# Patient Record
Sex: Female | Born: 1965 | Race: Black or African American | Hispanic: No | Marital: Married | Smoking: Never smoker
Health system: Southern US, Community
[De-identification: ages and names within clinical notes are randomized; demographics above are authoritative.]

## PROBLEM LIST (undated history)

## (undated) DIAGNOSIS — C801 Malignant (primary) neoplasm, unspecified: Secondary | ICD-10-CM

## (undated) DIAGNOSIS — M1990 Unspecified osteoarthritis, unspecified site: Secondary | ICD-10-CM

## (undated) HISTORY — PX: WISDOM TOOTH EXTRACTION: SHX21

## (undated) HISTORY — PX: BREAST SURGERY: SHX581

## (undated) HISTORY — PX: TONSILLECTOMY: SUR1361

## (undated) HISTORY — DX: Unspecified osteoarthritis, unspecified site: M19.90

## (undated) HISTORY — PX: BREAST EXCISIONAL BIOPSY: SUR124

---

## 1999-03-16 ENCOUNTER — Ambulatory Visit (HOSPITAL_COMMUNITY): Admission: RE | Admit: 1999-03-16 | Discharge: 1999-03-16 | Payer: Self-pay | Admitting: Family Medicine

## 1999-03-16 ENCOUNTER — Encounter: Payer: Self-pay | Admitting: Family Medicine

## 2000-09-08 ENCOUNTER — Other Ambulatory Visit: Admission: RE | Admit: 2000-09-08 | Discharge: 2000-09-08 | Payer: Self-pay | Admitting: Obstetrics and Gynecology

## 2002-06-07 ENCOUNTER — Other Ambulatory Visit: Admission: RE | Admit: 2002-06-07 | Discharge: 2002-06-07 | Payer: Self-pay | Admitting: Obstetrics and Gynecology

## 2002-06-09 ENCOUNTER — Encounter: Payer: Self-pay | Admitting: Obstetrics and Gynecology

## 2002-06-09 ENCOUNTER — Encounter: Admission: RE | Admit: 2002-06-09 | Discharge: 2002-06-09 | Payer: Self-pay | Admitting: Obstetrics and Gynecology

## 2003-07-11 ENCOUNTER — Other Ambulatory Visit: Admission: RE | Admit: 2003-07-11 | Discharge: 2003-07-11 | Payer: Self-pay | Admitting: Obstetrics and Gynecology

## 2005-06-26 ENCOUNTER — Other Ambulatory Visit: Admission: RE | Admit: 2005-06-26 | Discharge: 2005-06-26 | Payer: Self-pay | Admitting: Obstetrics and Gynecology

## 2006-01-20 ENCOUNTER — Inpatient Hospital Stay (HOSPITAL_COMMUNITY): Admission: AD | Admit: 2006-01-20 | Discharge: 2006-01-20 | Payer: Self-pay | Admitting: Obstetrics and Gynecology

## 2006-01-21 ENCOUNTER — Encounter (INDEPENDENT_AMBULATORY_CARE_PROVIDER_SITE_OTHER): Payer: Self-pay | Admitting: *Deleted

## 2006-01-21 ENCOUNTER — Inpatient Hospital Stay (HOSPITAL_COMMUNITY): Admission: AD | Admit: 2006-01-21 | Discharge: 2006-01-23 | Payer: Self-pay | Admitting: Obstetrics and Gynecology

## 2006-03-05 ENCOUNTER — Encounter: Admission: RE | Admit: 2006-03-05 | Discharge: 2006-03-05 | Payer: Self-pay | Admitting: Obstetrics and Gynecology

## 2006-12-05 ENCOUNTER — Encounter: Admission: RE | Admit: 2006-12-05 | Discharge: 2006-12-05 | Payer: Self-pay | Admitting: Obstetrics and Gynecology

## 2008-03-08 ENCOUNTER — Encounter: Admission: RE | Admit: 2008-03-08 | Discharge: 2008-03-08 | Payer: Self-pay | Admitting: Obstetrics and Gynecology

## 2009-04-04 ENCOUNTER — Encounter: Admission: RE | Admit: 2009-04-04 | Discharge: 2009-04-04 | Payer: Self-pay | Admitting: Obstetrics and Gynecology

## 2010-04-05 ENCOUNTER — Encounter: Admission: RE | Admit: 2010-04-05 | Discharge: 2010-04-05 | Payer: Self-pay | Admitting: Family Medicine

## 2010-10-26 NOTE — H&P (Signed)
Wendy Gates, Wendy Gates NO.:  [id number]   MEDICAL RECORD NO.:  [id number]          PATIENT TYPE:  INP   LOCATION:  9165                          FACILITY:  [facility]   PHYSICIAN:  Osborn Coho, M.D.   DATE OF BIRTH:  1965/07/02   DATE OF ADMISSION:  01/21/2006  DATE OF DISCHARGE:                                HISTORY & PHYSICAL   Wendy Gates is a 45 year old gravida 1, para 0 at 38-1/7 weeks who  presented with spontaneous rupture of membranes at 6:30 a.m. this morning.  She had been contracting all night.  She was seen yesterday in maternity  admissions status post a nonreactive NST with a blood pressure at that time  of 130/90.  Patient was sent to maternity admissions unit.  PIH laboratories  were normal.  BPP showed 6/8.  The patient was scheduled for follow-up in  the office today.  Pregnancy has been remarkable for positive group B Strep;  2) advanced maternal age with a normal first trimester screen and normal  AFP, amniocentesis was declined; 3) probable chronic hypertension.  Patient  was prescribed Aldomet at about 14 weeks but she never took the  prescription.  Blood pressures have been slightly labile at times, but had  had no consistent elevations.  She had a 24-hour urine on January 16, 2006  showing 197 mg of protein in a 24-hour specimen.  4) history of infertility;  5) history of ectopic; 6) migraines; 7) first trimester bleeding.   PRENATAL LABORATORIES:  Blood type is B+.  Rh antibody negative.  VDRL  nonreactive.  Rubella titer positive.  Hepatitis B surface antigen negative.  HIV was declined.  Sickle cell test was negative.  GC/Chlamydia cultures  were negative in the first trimester.  Pap was normal in first trimester.  Cystic fibrosis testing was declined.  Patient did have first trimester  screening at [facility] which was normal.  She also had a normal follow-  up AFP.  Group B Strep culture was positive at 36 weeks.  GC/Chlamydia  cultures were negative at that time.  EDC of February 03, 2006 was established  by last menstrual period and was in agreement with ultrasound at  approximately 6 weeks.  Hemoglobin upon entry into practice was 14.1.  It  was 11.1 at 26 weeks.  Patient's Glucola was also normal.   HISTORY OF PRESENT PREGNANCY:  Patient entered care at approximately 8  weeks.  She elected to have first trimester screening.  This was done at  [facility] with a normal follow-up AFP.  She had elevation of her blood  pressure at 14 weeks and was prescribed Aldomet 250 b.i.d.  A 24-hour urine  was also done for a baseline.  She did not end up taking the Aldomet.  She  had an ultrasound at 18 weeks showing normal growth and development with an  adjusted Down syndrome risk of 1 in 894.  She had a normal Glucola.  She had  another ultrasound at 32 weeks for size greater than dates.  Growth was at  the 88th percentile.  AFI was  17.  Fetus at that time was in a breech  presentation but then reevaluation at 34 weeks showed vertex.  Her blood  pressure had some lability in the third trimester.  A 24-hour urine was done  with [facility] laboratories.  A 24-hour urine showed 197 mg of protein with normal  PIH laboratories.  Bi-weekly NSTs were done.  She had an NST yesterday which  was August 13 showing nonreactivity with minimal variability with occasional  mild variables.  Blood pressure at that time was also 138/90.  She was sent  to [facility]U for further evaluation.  She had normal PIH laboratories.  Her blood  pressures were normal at MAU.  BPP was 6/8.  Clean catch urine was negative  and PIH laboratories were normal.  The decision was made to follow up today.  However, the patient's labor ensued prior to that time.   OBSTETRICAL HISTORY:  In 1992 she had a 4-6 week tubal pregnancy.  In 2005  she had a 4-6 week spontaneous miscarriage without complication.   MEDICAL HISTORY:  She is a previous [employer] user.  She  reports  usual childhood illnesses.  She also had a history of bladder infection  years ago.  She has a history of migraines.  She had no known medication  allergy.   FAMILY HISTORY:  Her father has heart disease.  Her father's sister and  brother have hypertension.  Her sister has anemia.  Her mother and maternal  grandmother are both deceased from complications of diabetes.  Her sister  has thyroid dysfunction.  Her mother died of breast cancer.  Her paternal  aunt died of breast cancer.   SURGICAL HISTORY:  Wisdom teeth removed in 2003 and tonsils removed in 1987.   GENETIC HISTORY:  Remarkable for the patient's advanced maternal age of 55.  Her paternal grandfather had twins.  Her niece has twins and several people  on the father of the baby's mother's side of the family have twins.   SOCIAL HISTORY:  Patient is married to the father of the baby.  He is  involved and supportive.  His name is Blonnie Maske.  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  Her husband has one year of college.  He is a  Emergency planning/management officer.  She has been followed by the physician service at [facility].  She denies any alcohol, drug, or tobacco use during this  pregnancy.   PHYSICAL EXAMINATION:  VITAL SIGNS:  Blood pressure is 126/79.  Other vital  signs are stable.  HEENT:  Within normal limits.  LUNGS:  Bilateral breath sounds are clear.  HEART:  Regular rate and rhythm without murmur.  BREASTS:  Soft and nontender.  ABDOMEN:  Fundal height is approximately 39 cm.  Estimated fetal weight is 7-  8 pounds.  Uterine contractions are every q.3 minutes, 90 seconds in  duration, moderate quality.  Fetal heart rate is currently nonreactive.  There are some mild variables noted with uterine contractions.  Fetal scalp  electrode was placed.  PELVIC:  Cervix initially on evaluation was 2, 90%, vertex at a -1.  After the second uterine contraction was variable a scalp lead was applied and the   cervix at that time was 3-4, 100%, vertex at a -1 station.  EXTREMITIES:  Deep tendon reflexes are 2+ without clonus.  There is a trace  edema noted.   IMPRESSION:  1. Intrauterine pregnancy at 38-1/7 weeks.  2.  Early labor with spontaneous rupture of membranes, clear fluid.  3. Positive group B Strep.  4. Variable decelerations.  5. Probable chronic hypertension, but on no current medications.   PLAN:  1. Admit to birthing suite for consult with Dr. Su Hilt as attending      physician.  2. Routine physician orders.  3. Plan group B Strep prophylaxis and penicillin G per standard dosing.  4. M.D.'s will follow.      Renaldo Reel Emilee Hero, C.N.M.      Osborn Coho, M.D.  Electronically Signed    VLL/MEDQ  D:  01/21/2006  T:  01/21/2006  Job:  [id number]

## 2011-03-20 ENCOUNTER — Other Ambulatory Visit: Payer: Self-pay | Admitting: Family Medicine

## 2011-03-20 DIAGNOSIS — Z1231 Encounter for screening mammogram for malignant neoplasm of breast: Secondary | ICD-10-CM

## 2011-04-10 ENCOUNTER — Ambulatory Visit: Payer: Self-pay

## 2011-04-16 ENCOUNTER — Ambulatory Visit
Admission: RE | Admit: 2011-04-16 | Discharge: 2011-04-16 | Disposition: A | Discharge status: Home / Self Care | Payer: BC Managed Care – PPO | Source: Ambulatory Visit | Attending: Family Medicine | Admitting: Family Medicine

## 2011-04-16 DIAGNOSIS — Z1231 Encounter for screening mammogram for malignant neoplasm of breast: Secondary | ICD-10-CM

## 2012-03-18 ENCOUNTER — Other Ambulatory Visit: Payer: Self-pay | Admitting: Family Medicine

## 2012-03-18 DIAGNOSIS — Z1231 Encounter for screening mammogram for malignant neoplasm of breast: Secondary | ICD-10-CM

## 2012-04-21 ENCOUNTER — Ambulatory Visit
Admission: RE | Admit: 2012-04-21 | Discharge: 2012-04-21 | Disposition: A | Discharge status: Home / Self Care | Payer: BC Managed Care – PPO | Source: Ambulatory Visit | Attending: Family Medicine | Admitting: Family Medicine

## 2012-04-21 DIAGNOSIS — Z1231 Encounter for screening mammogram for malignant neoplasm of breast: Secondary | ICD-10-CM

## 2012-04-29 ENCOUNTER — Ambulatory Visit (INDEPENDENT_AMBULATORY_CARE_PROVIDER_SITE_OTHER): Payer: BC Managed Care – PPO | Admitting: Obstetrics and Gynecology

## 2012-04-29 ENCOUNTER — Encounter: Payer: Self-pay | Admitting: Obstetrics and Gynecology

## 2012-04-29 VITALS — BP 112/80 | HR 80 | Ht 62.0 in | Wt 158.0 lb

## 2012-04-29 DIAGNOSIS — R32 Unspecified urinary incontinence: Secondary | ICD-10-CM

## 2012-04-29 DIAGNOSIS — N926 Irregular menstruation, unspecified: Secondary | ICD-10-CM

## 2012-04-29 DIAGNOSIS — Z124 Encounter for screening for malignant neoplasm of cervix: Secondary | ICD-10-CM

## 2012-04-29 DIAGNOSIS — Z01419 Encounter for gynecological examination (general) (routine) without abnormal findings: Secondary | ICD-10-CM

## 2012-04-29 MED ORDER — TOLTERODINE TARTRATE ER 2 MG PO CP24: 2.0000 mg | ORAL_CAPSULE | Freq: Every day | ORAL | Status: DC

## 2012-04-29 NOTE — Patient Instructions (Signed)
Urinary Incontinence Your doctor wants you to have this information about urinary incontinence. This is the inability to keep urine in your body until you decide to release it. CAUSES  Prostate gland enlargement is a common cause of urinary incontinence. But there are many different causes for losing urinary control. They include:  Medicines.  Infections.  Prostate problems.  Surgery.  Neurological diseases.  Emotional factors. DIAGNOSIS  Evaluating the cause of incontinence is important in choosing the best treatment. This may require:  An ultrasound exam.  Kidney and bladder X-rays.  Cystoscopy. This is an exam of the bladder using a narrow scope. TREATMENT  For incontinent patients, normal daily hygiene and using changing pads or adult diapers regularly will prevent offensive odors and skin damage from the moisture. Changing your medicines may help control incontinence. Your caregiver may prescribe some medicines to help you regain control. Avoid caffeine. It can over-stimulate the bladder. Use the bathroom regularly. Try about every 2 to 3 hours even if you do not feel the need. Take time to empty your bladder completely. After urinating, wait a minute. Then try to urinate again. External devices used to catch urine or an indwelling urine catheter (Foley catheter) may be needed as well. Some prostate gland problems require surgery to correct. Call your caregiver for more information. Document Released: 07/04/2004 Document Revised: 08/19/2011 Document Reviewed: 06/29/2008 ExitCare Patient Information 2013 [employer].  

## 2012-04-29 NOTE — Progress Notes (Signed)
Last Pap: 04/22/11 WNL: Yes Regular Periods:yes Contraception: none  Monthly Breast exam:yes Tetanus<41yrs:yes Nl.Bladder Function:yes Daily BMs:yes Healthy Diet:yes Calcium:no Mammogram:yes Date of Mammogram: 04/21/12 Exercise:yes Have often Exercise: walking daily  Seatbelt: yes Abuse at home: no Stressful work:yes Sigmoid-colonoscopy: n/a Bone Density: No PCP: Dr. Merri Brunette Change in PMH: none Change in ZOX:WRUE BP 112/80  Pulse 80  Ht 5\' 2"  (1.575 m)  Wt 158 lb (71.668 kg)  BMI 28.90 kg/m2  LMP 04/13/2012 Pt with complaints:yes 1. She c/o irregular vaginal bleeding for 4 months.  Her periods lasts for five days then on day 7 she has bleeding again.  No pain.  She uses a tampoon q 4 hours.  She also c/o urinary urgency.  No dysuria.  No caffiene intake daily she drinks two bottles of water a day Physical Examination: General appearance - alert, well appearing, and in no distress Mental status - normal mood, behavior, speech, dress, motor activity, and thought processes Neck - supple, no significant adenopathy,  thyroid exam: thyroid is normal in size without nodules or tenderness Chest - clear to auscultation, no wheezes, rales or rhonchi, symmetric air entry Heart - normal rate and regular rhythm Abdomen - soft, nontender, nondistended, no masses or organomegaly Breasts - breasts appear normal, no suspicious masses, no skin or nipple changes or axillary nodes Pelvic - normal external genitalia, vulva, vagina, cervix, uterus and adnexa Rectal - rectal exam not indicated Back exam - full range of motion, no tenderness, palpable spasm or pain on motion Neurological - alert, oriented, normal speech, no focal findings or movement disorder noted Musculoskeletal - no joint tenderness, deformity or swelling Extremities - no edema, redness or tenderness in the calves or thighs Skin - normal coloration and turgor, no rashes, no suspicious skin lesions noted Routine  exam Irregular bleeding Urge incontinence Pap sent yes Mammogram due no Natural Family Planning used for contraception RT for shg/emb/us Pt declined urodynamics.  Check urine cx.  Do kegels.  She desires to try detrol LA.  R&B reviewed

## 2012-04-30 LAB — PAP IG W/ RFLX HPV ASCU

## 2012-05-01 LAB — URINE CULTURE: Organism ID, Bacteria: NO GROWTH

## 2012-05-22 ENCOUNTER — Encounter: Payer: Self-pay | Admitting: Obstetrics and Gynecology

## 2012-05-22 ENCOUNTER — Ambulatory Visit (INDEPENDENT_AMBULATORY_CARE_PROVIDER_SITE_OTHER): Payer: BC Managed Care – PPO | Admitting: Obstetrics and Gynecology

## 2012-05-22 ENCOUNTER — Ambulatory Visit (INDEPENDENT_AMBULATORY_CARE_PROVIDER_SITE_OTHER): Payer: BC Managed Care – PPO

## 2012-05-22 VITALS — BP 102/70 | Wt 156.0 lb

## 2012-05-22 DIAGNOSIS — N926 Irregular menstruation, unspecified: Secondary | ICD-10-CM

## 2012-05-22 NOTE — Progress Notes (Signed)
Pt here for [facility] and EMBX SHG/EMBX:  The patient was consented for both procedures.  She was placed in dorsal lithotomy position and speculum placed in the vagina.  The cervix was cleansed with three betadine swabs.  The endometrial pipet was placed in the the endometrial cavity through the cervix.  The uterus did sound to 10cm.  The pipet was removed and specimen was sent to pathology.  The sonohysterography catheter was then placed through the cervix and vaginal probe placed back in the vagina. US uterus 9.96 by 6.86cm Normal endometrium Two fibroids one abuts the endometrial canal Largest is 3.8 cm All txs reviewed with the pt.  Pt chose obs for now

## 2012-05-22 NOTE — Patient Instructions (Signed)
Fibroids Fibroids are lumps (tumors) that can occur any place in a woman's body. These lumps are not cancerous. Fibroids vary in size, weight, and where they grow. HOME CARE  Do not take aspirin.  Write down the number of pads or tampons you use during your period. Tell your doctor. This can help determine the best treatment for you. GET HELP RIGHT AWAY IF:  You have pain in your lower belly (abdomen) that is not helped with medicine.  You have cramps that are not helped with medicine.  You have more bleeding between or during your period.  You feel lightheaded or pass out (faint).  Your lower belly pain gets worse. MAKE SURE YOU:  Understand these instructions.  Will watch your condition.  Will get help right away if you are not doing well or get worse. Document Released: 06/29/2010 Document Revised: 08/19/2011 Document Reviewed: 06/29/2010 ExitCare Patient Information 2013 [employer].  

## 2012-05-26 LAB — PATHOLOGY

## 2012-06-04 ENCOUNTER — Telehealth: Payer: Self-pay

## 2012-06-04 NOTE — Telephone Encounter (Signed)
Spoke with pt rgd labs informed endo biopsy wnl pt voice understanding 

## 2012-06-04 NOTE — Telephone Encounter (Signed)
Message copied by Rolla Plate on Thu Jun 04, 2012 10:28 AM ------      Message from: Jaymes Graff      Created: Mon Jun 01, 2012  8:49 AM       Please call the patient and let her know her endometrial biopsy is normal

## 2013-03-30 ENCOUNTER — Other Ambulatory Visit: Payer: Self-pay

## 2013-03-30 DIAGNOSIS — Z1231 Encounter for screening mammogram for malignant neoplasm of breast: Secondary | ICD-10-CM

## 2013-04-28 ENCOUNTER — Ambulatory Visit
Admission: RE | Admit: 2013-04-28 | Discharge: 2013-04-28 | Disposition: A | Discharge status: Home / Self Care | Payer: BC Managed Care – PPO | Source: Ambulatory Visit

## 2013-04-28 DIAGNOSIS — Z1231 Encounter for screening mammogram for malignant neoplasm of breast: Secondary | ICD-10-CM

## 2013-04-29 ENCOUNTER — Other Ambulatory Visit: Payer: Self-pay | Admitting: Obstetrics and Gynecology

## 2013-04-29 DIAGNOSIS — R928 Other abnormal and inconclusive findings on diagnostic imaging of breast: Secondary | ICD-10-CM

## 2013-05-11 ENCOUNTER — Ambulatory Visit
Admission: RE | Admit: 2013-05-11 | Discharge: 2013-05-11 | Disposition: A | Discharge status: Home / Self Care | Payer: BC Managed Care – PPO | Source: Ambulatory Visit | Attending: Obstetrics and Gynecology | Admitting: Obstetrics and Gynecology

## 2013-05-11 ENCOUNTER — Other Ambulatory Visit: Payer: Self-pay | Admitting: Obstetrics and Gynecology

## 2013-05-11 ENCOUNTER — Other Ambulatory Visit: Payer: Self-pay | Admitting: Diagnostic Radiology

## 2013-05-11 DIAGNOSIS — R928 Other abnormal and inconclusive findings on diagnostic imaging of breast: Secondary | ICD-10-CM

## 2013-05-11 DIAGNOSIS — R921 Mammographic calcification found on diagnostic imaging of breast: Secondary | ICD-10-CM

## 2013-05-11 DIAGNOSIS — R920 Mammographic microcalcification found on diagnostic imaging of breast: Secondary | ICD-10-CM

## 2013-05-18 ENCOUNTER — Ambulatory Visit (INDEPENDENT_AMBULATORY_CARE_PROVIDER_SITE_OTHER): Payer: BC Managed Care – PPO | Admitting: General Surgery

## 2013-05-18 ENCOUNTER — Encounter (INDEPENDENT_AMBULATORY_CARE_PROVIDER_SITE_OTHER): Payer: Self-pay

## 2013-05-18 ENCOUNTER — Encounter (INDEPENDENT_AMBULATORY_CARE_PROVIDER_SITE_OTHER): Payer: Self-pay | Admitting: General Surgery

## 2013-05-18 VITALS — BP 120/70 | HR 88 | Resp 16 | Ht 63.0 in | Wt 153.0 lb

## 2013-05-18 DIAGNOSIS — N62 Hypertrophy of breast: Secondary | ICD-10-CM

## 2013-05-18 DIAGNOSIS — N6099 Unspecified benign mammary dysplasia of unspecified breast: Secondary | ICD-10-CM

## 2013-05-18 NOTE — Patient Instructions (Signed)
Plan for right breast wire localized lumpectomy 

## 2013-05-18 NOTE — Progress Notes (Signed)
Patient ID: Wendy Gates, female   DOB: [birth year], 47 y.o.   MRN: [id number]  Chief Complaint  Patient presents with  . Breast Problem    lobular hyperplasia    HPI Wendy Gates is a 47 y.o. female.  We're asked to see the patient in consultation by Dr. Merri Brunette to evaluate her for a right breast abnormality. The patient is a 67 her black female who recently went for a routine screening mammogram. At that time she was not having any rest pain or discharge from the nipple. She was found to have an abnormal area of calcification measuring about 1.3 cm in the upper inner right breast. 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 She does have a family history of her mother who passed away of breast cancer at the age of 86  HPI  Past Medical History  Diagnosis Date  . Arthritis     History reviewed. No pertinent past surgical history.  Family History  Problem Relation Age of Onset  . Breast cancer Mother     and aunt  . Heart attack Father     Social History History  Substance Use Topics  . Smoking status: Never Smoker   . Smokeless tobacco: Not on file  . Alcohol Use: No    No Known Allergies  No current outpatient prescriptions on file.   No current facility-administered medications for this visit.    Review of Systems Review of Systems  Constitutional: Negative.   HENT: Negative.   Eyes: Negative.   Respiratory: Negative.   Cardiovascular: Negative.   Gastrointestinal: Negative.   Endocrine: Negative.   Genitourinary: Negative.   Musculoskeletal: Negative.   Skin: Negative.   Allergic/Immunologic: Negative.   Neurological: Negative.   Hematological: Negative.   Psychiatric/Behavioral: Negative.     Blood pressure 120/70, pulse 88, resp. rate 16, height 5\' 3"  (1.6 m), weight 153 lb (69.4 kg).  Physical Exam Physical Exam  Constitutional: She is oriented to person, place, and time. She appears well-developed and  well-nourished.  HENT:  Head: Normocephalic and atraumatic.  Eyes: Conjunctivae and EOM are normal. Pupils are equal, round, and reactive to light.  Neck: Normal range of motion. Neck supple.  Cardiovascular: Normal rate, regular rhythm and normal heart sounds.   Pulmonary/Chest: Effort normal and breath sounds normal.  There is a palpable bruise in the upper inner right breast. Other than this there is no other palpable mass in either breast although she does have very dense nodular breast tissue it is symmetric bilaterally. There is no palpable axillary, supraclavicular, or cervical lymphadenopathy  Abdominal: Soft. Bowel sounds are normal. She exhibits no mass. There is no tenderness.  Musculoskeletal: Normal range of motion.  Lymphadenopathy:    She has no cervical adenopathy.  Neurological: She is alert and oriented to person, place, and time.  Skin: Skin is warm and dry.  Psychiatric: She has a normal mood and affect. Her behavior is normal.    Data Reviewed As above  Assessment    The patient appears to have a small area of apical lobular hyperplasia in the upper inner right breast. Because this is a high-risk lesion I think it would be reasonable to remove this area. I've discussed with her in detail the risks and benefits of the operation to do this as well as some of the technical aspects and she understands and wishes to proceed     Plan    Plan for right breast  wire localized lumpectomy        TOTH III,PAUL S 05/18/2013, 12:20 PM

## 2013-06-15 ENCOUNTER — Telehealth (INDEPENDENT_AMBULATORY_CARE_PROVIDER_SITE_OTHER): Payer: Self-pay

## 2013-06-15 NOTE — Telephone Encounter (Signed)
Patient transferred to me asking about how much time she would need to be out of work. Surgery is on Wednesday 1/14. Advised going back Monday 1/19 would be reasonable. If she was still having discomfort at that time we could extend that out for another few days to a week. She will call if she needs a work note sent in.

## 2013-06-18 ENCOUNTER — Encounter (HOSPITAL_BASED_OUTPATIENT_CLINIC_OR_DEPARTMENT_OTHER): Payer: Self-pay | Admitting: *Deleted

## 2013-06-18 NOTE — Progress Notes (Signed)
No labs needed

## 2013-06-30 NOTE — Progress Notes (Signed)
Pt had flu-surg r/s-reviewed preop teaching

## 2013-07-05 ENCOUNTER — Ambulatory Visit (HOSPITAL_BASED_OUTPATIENT_CLINIC_OR_DEPARTMENT_OTHER): Payer: BC Managed Care – PPO | Admitting: Anesthesiology

## 2013-07-05 ENCOUNTER — Encounter (HOSPITAL_BASED_OUTPATIENT_CLINIC_OR_DEPARTMENT_OTHER): Payer: Self-pay | Admitting: *Deleted

## 2013-07-05 ENCOUNTER — Ambulatory Visit
Admission: RE | Admit: 2013-07-05 | Discharge: 2013-07-05 | Disposition: A | Discharge status: Home / Self Care | Payer: BC Managed Care – PPO | Source: Ambulatory Visit | Attending: General Surgery | Admitting: General Surgery

## 2013-07-05 ENCOUNTER — Encounter (HOSPITAL_BASED_OUTPATIENT_CLINIC_OR_DEPARTMENT_OTHER)
Admission: RE | Disposition: A | Discharge status: Home / Self Care | Payer: Self-pay | Source: Ambulatory Visit | Attending: General Surgery

## 2013-07-05 ENCOUNTER — Ambulatory Visit (HOSPITAL_BASED_OUTPATIENT_CLINIC_OR_DEPARTMENT_OTHER)
Admission: RE | Admit: 2013-07-05 | Discharge: 2013-07-05 | Disposition: A | Discharge status: Home / Self Care | Payer: BC Managed Care – PPO | Source: Ambulatory Visit | Attending: General Surgery | Admitting: General Surgery

## 2013-07-05 ENCOUNTER — Encounter (HOSPITAL_BASED_OUTPATIENT_CLINIC_OR_DEPARTMENT_OTHER): Payer: BC Managed Care – PPO | Admitting: Anesthesiology

## 2013-07-05 DIAGNOSIS — C50919 Malignant neoplasm of unspecified site of unspecified female breast: Secondary | ICD-10-CM | POA: Insufficient documentation

## 2013-07-05 DIAGNOSIS — N6099 Unspecified benign mammary dysplasia of unspecified breast: Secondary | ICD-10-CM

## 2013-07-05 DIAGNOSIS — D4860 Neoplasm of uncertain behavior of unspecified breast: Secondary | ICD-10-CM

## 2013-07-05 DIAGNOSIS — R920 Mammographic microcalcification found on diagnostic imaging of breast: Secondary | ICD-10-CM

## 2013-07-05 DIAGNOSIS — N6089 Other benign mammary dysplasias of unspecified breast: Secondary | ICD-10-CM

## 2013-07-05 HISTORY — PX: BREAST LUMPECTOMY WITH NEEDLE LOCALIZATION: SHX5759

## 2013-07-05 LAB — POCT HEMOGLOBIN-HEMACUE: Hemoglobin: 11.1 g/dL — ABNORMAL LOW (ref 12.0–15.0)

## 2013-07-05 SURGERY — BREAST LUMPECTOMY WITH NEEDLE LOCALIZATION
Anesthesia: General | Site: Breast | Laterality: Right

## 2013-07-05 MED ORDER — CHLORHEXIDINE GLUCONATE 4 % EX LIQD: 1.0000 "application " | Freq: Once | CUTANEOUS | Status: DC

## 2013-07-05 MED ORDER — MIDAZOLAM HCL 2 MG/2ML IJ SOLN: 1.0000 mg | INTRAMUSCULAR | Status: DC | PRN

## 2013-07-05 MED ORDER — HYDROMORPHONE HCL PF 1 MG/ML IJ SOLN
INTRAMUSCULAR | Status: AC
  Filled 2013-07-05: qty 1

## 2013-07-05 MED ORDER — ONDANSETRON HCL 4 MG/2ML IJ SOLN: 4.0000 mg | Freq: Once | INTRAMUSCULAR | Status: DC | PRN

## 2013-07-05 MED ORDER — OXYCODONE HCL 5 MG PO TABS
5.0000 mg | ORAL_TABLET | Freq: Once | ORAL | Status: AC | PRN
  Administered 2013-07-05: 5 mg via ORAL
  Filled 2013-07-05: qty 1

## 2013-07-05 MED ORDER — ONDANSETRON HCL 4 MG/2ML IJ SOLN
INTRAMUSCULAR | Status: DC | PRN
  Administered 2013-07-05: 4 mg via INTRAVENOUS

## 2013-07-05 MED ORDER — PROPOFOL 10 MG/ML IV BOLUS
INTRAVENOUS | Status: DC | PRN
  Administered 2013-07-05: 70 mg via INTRAVENOUS
  Administered 2013-07-05: 200 mg via INTRAVENOUS
  Administered 2013-07-05: 50 mg via INTRAVENOUS

## 2013-07-05 MED ORDER — 0.9 % SODIUM CHLORIDE (POUR BTL) OPTIME
TOPICAL | Status: DC | PRN
  Administered 2013-07-05: 300 mL

## 2013-07-05 MED ORDER — CEFAZOLIN SODIUM-DEXTROSE 2-3 GM-% IV SOLR
INTRAVENOUS | Status: AC
  Filled 2013-07-05: qty 50

## 2013-07-05 MED ORDER — FENTANYL CITRATE 0.05 MG/ML IJ SOLN: 50.0000 ug | INTRAMUSCULAR | Status: DC | PRN

## 2013-07-05 MED ORDER — BUPIVACAINE HCL (PF) 0.25 % IJ SOLN
INTRAMUSCULAR | Status: DC | PRN
  Administered 2013-07-05: 17 mL

## 2013-07-05 MED ORDER — CEFAZOLIN SODIUM-DEXTROSE 2-3 GM-% IV SOLR: 2.0000 g | INTRAVENOUS | Status: DC

## 2013-07-05 MED ORDER — OXYCODONE-ACETAMINOPHEN 5-325 MG PO TABS: 1.0000 | ORAL_TABLET | ORAL | Status: DC | PRN

## 2013-07-05 MED ORDER — LIDOCAINE HCL (CARDIAC) 20 MG/ML IV SOLN
INTRAVENOUS | Status: DC | PRN
  Administered 2013-07-05: 80 mg via INTRAVENOUS

## 2013-07-05 MED ORDER — FENTANYL CITRATE 0.05 MG/ML IJ SOLN
INTRAMUSCULAR | Status: DC | PRN
  Administered 2013-07-05: 50 ug via INTRAVENOUS
  Administered 2013-07-05: 100 ug via INTRAVENOUS

## 2013-07-05 MED ORDER — OXYCODONE HCL 5 MG/5ML PO SOLN: 5.0000 mg | Freq: Once | ORAL | Status: AC | PRN

## 2013-07-05 MED ORDER — PHENYLEPHRINE HCL 10 MG/ML IJ SOLN
INTRAMUSCULAR | Status: DC | PRN
  Administered 2013-07-05 (×2): 80 ug via INTRAVENOUS

## 2013-07-05 MED ORDER — DEXAMETHASONE SODIUM PHOSPHATE 4 MG/ML IJ SOLN
INTRAMUSCULAR | Status: DC | PRN
  Administered 2013-07-05: 10 mg via INTRAVENOUS

## 2013-07-05 MED ORDER — MIDAZOLAM HCL 5 MG/5ML IJ SOLN
INTRAMUSCULAR | Status: DC | PRN
  Administered 2013-07-05: 2 mg via INTRAVENOUS

## 2013-07-05 MED ORDER — LACTATED RINGERS IV SOLN
INTRAVENOUS | Status: DC
  Administered 2013-07-05 (×3): via INTRAVENOUS

## 2013-07-05 MED ORDER — HYDROMORPHONE HCL PF 1 MG/ML IJ SOLN
0.2500 mg | INTRAMUSCULAR | Status: DC | PRN
  Administered 2013-07-05 (×2): 0.25 mg via INTRAVENOUS

## 2013-07-05 SURGICAL SUPPLY — 44 items
ADH SKN CLS APL DERMABOND .7 (GAUZE/BANDAGES/DRESSINGS) ×1
BLADE SURG 10 STRL SS (BLADE) ×2 IMPLANT
BLADE SURG 15 STRL LF DISP TIS (BLADE) ×1 IMPLANT
BLADE SURG 15 STRL SS (BLADE) ×2
CANISTER SUCT 1200ML W/VALVE (MISCELLANEOUS) ×2 IMPLANT
CHLORAPREP W/TINT 26ML (MISCELLANEOUS) ×2 IMPLANT
CLIP TI WIDE RED SMALL 6 (CLIP) IMPLANT
COVER MAYO STAND STRL (DRAPES) ×2 IMPLANT
COVER TABLE BACK 60X90 (DRAPES) ×2 IMPLANT
DECANTER SPIKE VIAL GLASS SM (MISCELLANEOUS) ×2 IMPLANT
DERMABOND ADVANCED (GAUZE/BANDAGES/DRESSINGS) ×1
DERMABOND ADVANCED .7 DNX12 (GAUZE/BANDAGES/DRESSINGS) ×1 IMPLANT
DEVICE DUBIN W/COMP PLATE 8390 (MISCELLANEOUS) ×1 IMPLANT
DRAPE LAPAROSCOPIC ABDOMINAL (DRAPES) ×2 IMPLANT
DRAPE UTILITY XL STRL (DRAPES) ×2 IMPLANT
ELECT COATED BLADE 2.86 ST (ELECTRODE) ×2 IMPLANT
ELECT REM PT RETURN 9FT ADLT (ELECTROSURGICAL) ×2
ELECTRODE REM PT RTRN 9FT ADLT (ELECTROSURGICAL) ×1 IMPLANT
GLOVE BIO SURGEON STRL SZ7.5 (GLOVE) ×2 IMPLANT
GLOVE BIOGEL PI IND STRL 7.0 (GLOVE) IMPLANT
GLOVE BIOGEL PI INDICATOR 7.0 (GLOVE) ×1
GLOVE ECLIPSE 7.0 STRL STRAW (GLOVE) ×1 IMPLANT
GLOVE EXAM NITRILE MD LF STRL (GLOVE) ×1 IMPLANT
GOWN STRL REUS W/ TWL LRG LVL3 (GOWN DISPOSABLE) ×2 IMPLANT
GOWN STRL REUS W/TWL LRG LVL3 (GOWN DISPOSABLE) ×4
KIT MARKER MARGIN INK (KITS) ×1 IMPLANT
NDL HYPO 25X1 1.5 SAFETY (NEEDLE) ×1 IMPLANT
NEEDLE HYPO 25X1 1.5 SAFETY (NEEDLE) ×2 IMPLANT
NS IRRIG 1000ML POUR BTL (IV SOLUTION) ×2 IMPLANT
PACK BASIN DAY SURGERY FS (CUSTOM PROCEDURE TRAY) ×2 IMPLANT
PENCIL BUTTON HOLSTER BLD 10FT (ELECTRODE) ×2 IMPLANT
SLEEVE SCD COMPRESS KNEE MED (MISCELLANEOUS) ×2 IMPLANT
SPONGE LAP 18X18 X RAY DECT (DISPOSABLE) ×2 IMPLANT
STAPLER VISISTAT 35W (STAPLE) IMPLANT
SUT MON AB 4-0 PC3 18 (SUTURE) ×2 IMPLANT
SUT SILK 2 0 SH (SUTURE) ×2 IMPLANT
SUT VIC AB 3-0 54X BRD REEL (SUTURE) IMPLANT
SUT VIC AB 3-0 BRD 54 (SUTURE)
SUT VICRYL 3-0 CR8 SH (SUTURE) ×2 IMPLANT
SYR CONTROL 10ML LL (SYRINGE) ×2 IMPLANT
TOWEL OR 17X24 6PK STRL BLUE (TOWEL DISPOSABLE) ×2 IMPLANT
TOWEL OR NON WOVEN STRL DISP B (DISPOSABLE) ×2 IMPLANT
TUBE CONNECTING 20X1/4 (TUBING) ×2 IMPLANT
YANKAUER SUCT BULB TIP NO VENT (SUCTIONS) ×2 IMPLANT

## 2013-07-05 NOTE — H&P (Signed)
Wendy Gates  05/18/2013 11:30 AM   Office Visit  MRN:  [id number]   Description: 48 year old female  Provider: Merrie Roof, MD  Department: [facility]          Diagnoses      Atypical lobular hyperplasia of breast    -  Primary      611.1             Reason for Visit      Breast Problem      lobular hyperplasia               Current Vitals - Last Recorded      BP Pulse Resp Ht Wt BMI      120/70 88 16 5\' 3"  (1.6 m) 153 lb (69.4 kg) 27.11 kg/m2            LMP                06/16/2013                        Progress Notes      Merrie Roof, MD at 05/18/2013 12:20 PM      Status: Signed            Patient ID: Wendy Gates, female   DOB: 03-Jan-1966, 48 y.o.   MRN: [id number]    Chief Complaint   Patient presents with   .  Breast Problem       lobular hyperplasia        HPI Wendy Gates is a 48 y.o. female.  We're asked to see the patient in consultation by Dr. Carol Ada to evaluate her for a right breast abnormality. The patient is a 72 her black female who recently went for a routine screening mammogram. At that time she was not having any rest pain or discharge from the nipple. She was found to have an abnormal area of calcification measuring about 1.3 cm in the upper inner right breast. 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 She does have a family history of her mother who passed away of breast cancer at the age of 52  HPI    Past Medical History   Diagnosis  Date   .  Arthritis          History reviewed. No pertinent past surgical history.    Family History   Problem  Relation  Age of Onset   .  Breast cancer  Mother         and aunt   .  Heart attack  Father          Social History History   Substance Use Topics   .  Smoking status:  Never Smoker    .  Smokeless tobacco:  Not on file   .  Alcohol Use:  No        No Known Allergies    No current outpatient prescriptions  on file.       No current facility-administered medications for this visit.        Review of Systems Review of Systems  Constitutional: Negative.   HENT: Negative.   Eyes: Negative.   Respiratory: Negative.   Cardiovascular: Negative.   Gastrointestinal: Negative.   Endocrine: Negative.   Genitourinary: Negative.   Musculoskeletal: Negative.   Skin: Negative.   Allergic/Immunologic: Negative.   Neurological: Negative.  Hematological: Negative.   Psychiatric/Behavioral: Negative.       Blood pressure 120/70, pulse 88, resp. rate 16, height 5\' 3"  (1.6 m), weight 153 lb (69.4 kg).   Physical Exam Physical Exam  Constitutional: She is oriented to person, place, and time. She appears well-developed and well-nourished.  HENT:   Head: Normocephalic and atraumatic.  Eyes: Conjunctivae and EOM are normal. Pupils are equal, round, and reactive to light.  Neck: Normal range of motion. Neck supple.  Cardiovascular: Normal rate, regular rhythm and normal heart sounds.   Pulmonary/Chest: Effort normal and breath sounds normal.  There is a palpable bruise in the upper inner right breast. Other than this there is no other palpable mass in either breast although she does have very dense nodular breast tissue it is symmetric bilaterally. There is no palpable axillary, supraclavicular, or cervical lymphadenopathy  Abdominal: Soft. Bowel sounds are normal. She exhibits no mass. There is no tenderness.  Musculoskeletal: Normal range of motion.  Lymphadenopathy:    She has no cervical adenopathy.  Neurological: She is alert and oriented to person, place, and time.  Skin: Skin is warm and dry.  Psychiatric: She has a normal mood and affect. Her behavior is normal.      Data Reviewed As above   Assessment    The patient appears to have a small area of apical lobular hyperplasia in the upper inner right breast. Because this is a high-risk lesion I think it would be reasonable to  remove this area. I've discussed with her in detail the risks and benefits of the operation to do this as well as some of the technical aspects and she understands and wishes to proceed      Plan    Plan for right breast wire localized lumpectomy

## 2013-07-05 NOTE — Anesthesia Postprocedure Evaluation (Signed)
Anesthesia Post Note  Patient: Wendy Gates  Procedure(s) Performed: Procedure(s) (LRB): RIGHT BREAST WIRE LOCALIZATION LUMPECTOMY  (Right)  Anesthesia type: General  Patient location: PACU  Post pain: Pain level controlled and Adequate analgesia  Post assessment: Post-op Vital signs reviewed, Patient's Cardiovascular Status Stable, Respiratory Function Stable, Patent Airway and Pain level controlled  Last Vitals:  Filed Vitals:   07/05/13 1500  BP:   Pulse: 74  Temp:   Resp: 14    Post vital signs: Reviewed and stable  Level of consciousness: awake, alert  and oriented  Complications: No apparent anesthesia complications

## 2013-07-05 NOTE — Anesthesia Procedure Notes (Signed)
Procedure Name: LMA Insertion Date/Time: 07/05/2013 1:21 PM Performed by: Maryella Shivers Pre-anesthesia Checklist: Patient identified, Emergency Drugs available, Suction available and Patient being monitored Patient Re-evaluated:Patient Re-evaluated prior to inductionOxygen Delivery Method: Circle System Utilized Preoxygenation: Pre-oxygenation with 100% oxygen Intubation Type: IV induction Ventilation: Mask ventilation without difficulty LMA: LMA inserted LMA Size: 4.0 Number of attempts: 1 Airway Equipment and Method: bite block Placement Confirmation: positive ETCO2 Tube secured with: Tape Dental Injury: Teeth and Oropharynx as per pre-operative assessment

## 2013-07-05 NOTE — Discharge Instructions (Signed)

## 2013-07-05 NOTE — Transfer of Care (Signed)
Immediate Anesthesia Transfer of Care Note  Patient: Wendy Gates  Procedure(s) Performed: Procedure(s): RIGHT BREAST WIRE LOCALIZATION LUMPECTOMY  (Right)  Patient Location: PACU  Anesthesia Type:General  Level of Consciousness: awake, alert  and oriented  Airway & Oxygen Therapy: Patient Spontanous Breathing and Patient connected to face mask oxygen  Post-op Assessment: Report given to PACU RN and Post -op Vital signs reviewed and stable  Post vital signs: Reviewed and stable  Complications: No apparent anesthesia complications

## 2013-07-05 NOTE — Interval H&P Note (Signed)
History and Physical Interval Note:  07/05/2013 12:54 PM  Wendy Gates  has presented today for surgery, with the diagnosis of right breast atypical lobular hyperplasia  The various methods of treatment have been discussed with the patient and family. After consideration of risks, benefits and other options for treatment, the patient has consented to  Procedure(s): RIGHT BREAST WIRE LOCALIZATION LUMPECTOMY  (Right) as a surgical intervention .  The patient's history has been reviewed, patient examined, no change in status, stable for surgery.  I have reviewed the patient's chart and labs.  Questions were answered to the patient's satisfaction.     TOTH III,PAUL S

## 2013-07-05 NOTE — Anesthesia Preprocedure Evaluation (Signed)

## 2013-07-05 NOTE — Op Note (Signed)
07/05/2013  2:26 PM  PATIENT:  Wendy Gates  48 y.o. female  PRE-OPERATIVE DIAGNOSIS:  Right breast atypical lobular hyperplasia  POST-OPERATIVE DIAGNOSIS:  Right breast atypical lobular hyperplasia  PROCEDURE:  Procedure(s): RIGHT BREAST WIRE LOCALIZATION LUMPECTOMY  (Right)  SURGEON:  Surgeon(s) and Role:    * Merrie Roof, MD - Primary  PHYSICIAN ASSISTANT:   ASSISTANTS: none   ANESTHESIA:   general  EBL:  Total I/O In: 1700 [I.V.:1700] Out: -   BLOOD ADMINISTERED:none  DRAINS: none   LOCAL MEDICATIONS USED:  MARCAINE     SPECIMEN:  Source of Specimen:  right breast tissue  DISPOSITION OF SPECIMEN:  PATHOLOGY  COUNTS:  YES  TOURNIQUET:  * No tourniquets in log *  DICTATION: .Dragon Dictation After informed consent was obtained the patient was brought to the operating room and placed in the supine position on the operating table. After adequate induction of general anesthesia the patient's right breast was prepped with ChloraPrep, allowed to dry, and draped in the usual sterile manner. Earlier in the day the patient underwent wire localization procedure and the wire was entering the right breast in the upper inner quadrant and headed laterally. A transversely oriented incision was made in the upper inner quadrant of the right breast overlying the area. This was done with a 15 blade knife. This incision was carried through the skin and subcutaneous tissue sharply with electrocautery. Once into the breast tissue the path of the wire could be palpated. A circular portion of breast tissue was excised sharply around the path of the wire. This was done sharply with the electrocautery. Once the specimen was removed it was oriented with the assigned paint colors. A specimen radiograph was obtained that showed the clip to be near the superior margin. An additional superior margin was taken sharply with electrocautery and sent separately. All tissue was sent to pathology for  further evaluation. Hemostasis was achieved using the Bovie electrocautery. The wound was infiltrated with quarter percent Marcaine and irrigated with copious amounts of saline. The deep layer was then closed with interrupted 3-0 Vicryl stitches. Skin was then closed with interrupted 4-0 Monocryl subcuticular stitches. Dermabond dressings were applied. The patient tolerated the procedure well. At the end of the case all needle sponge and instrument counts were correct. The patient was then awakened and taken to recovery in stable condition.  PLAN OF CARE: Discharge to home after PACU  PATIENT DISPOSITION:  PACU - hemodynamically stable.   Delay start of Pharmacological VTE agent (>24hrs) due to surgical blood loss or risk of bleeding: not applicable

## 2013-07-07 ENCOUNTER — Encounter (HOSPITAL_BASED_OUTPATIENT_CLINIC_OR_DEPARTMENT_OTHER): Payer: Self-pay | Admitting: General Surgery

## 2013-07-09 ENCOUNTER — Ambulatory Visit (INDEPENDENT_AMBULATORY_CARE_PROVIDER_SITE_OTHER): Payer: BC Managed Care – PPO | Admitting: General Surgery

## 2013-07-09 ENCOUNTER — Other Ambulatory Visit (INDEPENDENT_AMBULATORY_CARE_PROVIDER_SITE_OTHER): Payer: Self-pay

## 2013-07-09 ENCOUNTER — Encounter (INDEPENDENT_AMBULATORY_CARE_PROVIDER_SITE_OTHER): Payer: Self-pay

## 2013-07-09 ENCOUNTER — Encounter (INDEPENDENT_AMBULATORY_CARE_PROVIDER_SITE_OTHER): Payer: Self-pay | Admitting: General Surgery

## 2013-07-09 VITALS — BP 124/82 | HR 72 | Resp 14 | Ht 63.0 in | Wt 154.2 lb

## 2013-07-09 DIAGNOSIS — D0501 Lobular carcinoma in situ of right breast: Secondary | ICD-10-CM | POA: Insufficient documentation

## 2013-07-09 DIAGNOSIS — D0500 Lobular carcinoma in situ of unspecified breast: Secondary | ICD-10-CM

## 2013-07-09 DIAGNOSIS — D0590 Unspecified type of carcinoma in situ of unspecified breast: Secondary | ICD-10-CM

## 2013-07-09 NOTE — Progress Notes (Signed)
Subjective:     Patient ID: Wendy Gates, female   DOB: 1966/01/22, 48 y.o.   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 She tolerated the surgery well and has no complaints today.  Review of Systems     Objective:   Physical Exam On exam her right breast incision is healing nicely with no sign of infection or significant seroma    Assessment:     The patient is one week status post right lumpectomy for LCIS     Plan:     At this point we will plan to see her back in about a month or 2 to check her progress. I will refer her to the high-risk breast clinic to talk about risk reduction.

## 2013-07-09 NOTE — Patient Instructions (Signed)
Will refer to high risk breast clinic

## 2013-07-12 ENCOUNTER — Telehealth: Payer: Self-pay | Admitting: Oncology

## 2013-07-12 NOTE — Telephone Encounter (Signed)
C/D 07/12/13 for appt. 08/03/13

## 2013-07-12 NOTE — Telephone Encounter (Signed)
LEFT MESSAGE AND GAVE HIGH RISK [facility] APPT 02/24 @ 1 W/DR. KHAN.  WELCOME PACKET MAILED.

## 2013-08-02 ENCOUNTER — Telehealth: Payer: Self-pay | Admitting: Oncology

## 2013-08-02 NOTE — Telephone Encounter (Signed)
LEFT MESSAGE FOR PATIENT INFORMING HIGH RISK APPT FOR TOMORROW HAS BEEN R/S AND TO CALL TO CONFIRM MESSAGE WAS RECEIVED.

## 2013-08-03 ENCOUNTER — Encounter: Payer: BC Managed Care – PPO | Admitting: Oncology

## 2013-08-17 ENCOUNTER — Encounter (INDEPENDENT_AMBULATORY_CARE_PROVIDER_SITE_OTHER): Payer: Self-pay | Admitting: General Surgery

## 2013-08-17 ENCOUNTER — Ambulatory Visit (INDEPENDENT_AMBULATORY_CARE_PROVIDER_SITE_OTHER): Payer: BC Managed Care – PPO | Admitting: General Surgery

## 2013-08-17 VITALS — BP 128/76 | HR 77 | Temp 97.5°F | Resp 16 | Ht 63.0 in | Wt 151.0 lb

## 2013-08-17 DIAGNOSIS — D0500 Lobular carcinoma in situ of unspecified breast: Secondary | ICD-10-CM

## 2013-08-17 DIAGNOSIS — D0590 Unspecified type of carcinoma in situ of unspecified breast: Secondary | ICD-10-CM

## 2013-08-17 NOTE — Patient Instructions (Signed)
Reschedule appt with Dr. Humphrey Rolls Continue regular self exams

## 2013-08-17 NOTE — Progress Notes (Signed)
Subjective:     Patient ID: Wendy Gates, female   DOB: 1966/04/29, 48 y.o.   MRN: [id number]  HPI The patient is a 48 year old black female who is 6 weeks status post right lumpectomy for LCIS. She tolerated the surgery well. Her only complaint is of some occasional sharp shooting pains that do not last. She has not yet been able to reschedule her appointment with Dr. Chancy Milroy  Review of Systems     Objective:   Physical Exam On exam her right breast incision is healing nicely with no sign of infection or significant seroma    Assessment:     The patient is 6 weeks status post right lumpectomy for LCIS     Plan:     At this point I would recommend continuing to do regular self exams. She will need to reschedule her appointment with Dr. Humphrey Rolls in the high risk clinic. I will plan to see her back in about 6 months.

## 2013-08-18 ENCOUNTER — Encounter (INDEPENDENT_AMBULATORY_CARE_PROVIDER_SITE_OTHER): Payer: BC Managed Care – PPO | Admitting: General Surgery

## 2013-08-24 ENCOUNTER — Other Ambulatory Visit: Payer: Self-pay | Admitting: Obstetrics and Gynecology

## 2013-09-27 ENCOUNTER — Telehealth: Payer: Self-pay | Admitting: Oncology

## 2013-09-27 NOTE — Telephone Encounter (Signed)
LVM ADVISING 4/21 APPT CANCELLED AND MOVED TO 4/28 @ 1PM WITH DR. Earnest Conroy DUE TO DR. KK LOA. TIA

## 2013-09-28 ENCOUNTER — Encounter: Payer: BC Managed Care – PPO | Admitting: Oncology

## 2013-10-08 ENCOUNTER — Telehealth: Payer: Self-pay | Admitting: *Deleted

## 2013-10-08 NOTE — Telephone Encounter (Signed)
Left message for pt to return my call so I can reschedule her High Risk Appt.

## 2013-11-25 ENCOUNTER — Telehealth: Payer: Self-pay | Admitting: *Deleted

## 2013-11-25 NOTE — Telephone Encounter (Signed)
Called pt and confirmed 12/07/13 High Risk appt w/ pt.  Mailed welcoming packet and calendar to pt.

## 2013-12-02 ENCOUNTER — Telehealth: Payer: Self-pay | Admitting: *Deleted

## 2013-12-02 NOTE — Telephone Encounter (Signed)
Called and informed pt about Dr. Grayland Ormond and confirmed 12/13/13 appt w/ pt.

## 2013-12-02 NOTE — Telephone Encounter (Signed)
Left vm for pt to return call to r/s high risk appt. Contact information given.

## 2013-12-07 ENCOUNTER — Other Ambulatory Visit: Payer: BC Managed Care – PPO

## 2013-12-07 ENCOUNTER — Encounter: Payer: BC Managed Care – PPO | Admitting: Oncology

## 2013-12-14 ENCOUNTER — Other Ambulatory Visit: Payer: Self-pay | Admitting: *Deleted

## 2013-12-14 ENCOUNTER — Telehealth: Payer: Self-pay | Admitting: Oncology

## 2013-12-14 DIAGNOSIS — D0500 Lobular carcinoma in situ of unspecified breast: Secondary | ICD-10-CM

## 2013-12-14 NOTE — Telephone Encounter (Signed)
MOVED 7/8 APPT TO 7/10 DUE TO NO COVERAGE. LMONVM FOR PT RE CHANGE W/NEW D/T.

## 2013-12-15 ENCOUNTER — Other Ambulatory Visit: Payer: BC Managed Care – PPO

## 2013-12-15 ENCOUNTER — Telehealth: Payer: Self-pay | Admitting: Oncology

## 2013-12-15 NOTE — Telephone Encounter (Signed)
pt returned my call re r/s 7/8 appt. pt cannot come 7/10. pt given new appt for 7/16.

## 2013-12-17 ENCOUNTER — Other Ambulatory Visit: Payer: BC Managed Care – PPO

## 2013-12-21 ENCOUNTER — Telehealth: Payer: Self-pay | Admitting: Oncology

## 2013-12-21 NOTE — Telephone Encounter (Signed)
Pt cld to cancel 07/16 states she can't get off work r/s 07/17 ........KJ

## 2013-12-23 ENCOUNTER — Other Ambulatory Visit: Payer: Self-pay | Admitting: *Deleted

## 2013-12-23 ENCOUNTER — Other Ambulatory Visit: Payer: BC Managed Care – PPO

## 2013-12-23 DIAGNOSIS — D0500 Lobular carcinoma in situ of unspecified breast: Secondary | ICD-10-CM

## 2013-12-23 DIAGNOSIS — N6099 Unspecified benign mammary dysplasia of unspecified breast: Secondary | ICD-10-CM

## 2013-12-24 ENCOUNTER — Other Ambulatory Visit: Payer: Self-pay | Admitting: *Deleted

## 2013-12-24 ENCOUNTER — Other Ambulatory Visit (HOSPITAL_BASED_OUTPATIENT_CLINIC_OR_DEPARTMENT_OTHER): Payer: BC Managed Care – PPO

## 2013-12-24 ENCOUNTER — Ambulatory Visit (HOSPITAL_BASED_OUTPATIENT_CLINIC_OR_DEPARTMENT_OTHER): Payer: BC Managed Care – PPO | Admitting: Hematology

## 2013-12-24 VITALS — BP 126/75 | HR 67 | Temp 98.4°F | Resp 20 | Ht 63.0 in | Wt 154.6 lb

## 2013-12-24 DIAGNOSIS — D0590 Unspecified type of carcinoma in situ of unspecified breast: Secondary | ICD-10-CM

## 2013-12-24 DIAGNOSIS — N6099 Unspecified benign mammary dysplasia of unspecified breast: Secondary | ICD-10-CM

## 2013-12-24 DIAGNOSIS — D649 Anemia, unspecified: Secondary | ICD-10-CM

## 2013-12-24 DIAGNOSIS — D0500 Lobular carcinoma in situ of unspecified breast: Secondary | ICD-10-CM

## 2013-12-24 DIAGNOSIS — Z803 Family history of malignant neoplasm of breast: Secondary | ICD-10-CM

## 2013-12-24 DIAGNOSIS — C50919 Malignant neoplasm of unspecified site of unspecified female breast: Secondary | ICD-10-CM

## 2013-12-24 LAB — CBC WITH DIFFERENTIAL/PLATELET
BASO%: 1 % (ref 0.0–2.0)
BASOS ABS: 0 10*3/uL (ref 0.0–0.1)
EOS%: 1.8 % (ref 0.0–7.0)
Eosinophils Absolute: 0.1 10*3/uL (ref 0.0–0.5)
HEMATOCRIT: 33 % — AB (ref 34.8–46.6)
HGB: 10.1 g/dL — ABNORMAL LOW (ref 11.6–15.9)
LYMPH#: 2 10*3/uL (ref 0.9–3.3)
LYMPH%: 43.9 % (ref 14.0–49.7)
MCH: 22 pg — AB (ref 25.1–34.0)
MCHC: 30.6 g/dL — ABNORMAL LOW (ref 31.5–36.0)
MCV: 71.9 fL — AB (ref 79.5–101.0)
MONO#: 0.4 10*3/uL (ref 0.1–0.9)
MONO%: 7.9 % (ref 0.0–14.0)
NEUT#: 2.1 10*3/uL (ref 1.5–6.5)
NEUT%: 45.4 % (ref 38.4–76.8)
PLATELETS: 215 10*3/uL (ref 145–400)
RBC: 4.59 10*6/uL (ref 3.70–5.45)
RDW: 16.6 % — ABNORMAL HIGH (ref 11.2–14.5)
WBC: 4.6 10*3/uL (ref 3.9–10.3)

## 2013-12-24 LAB — COMPREHENSIVE METABOLIC PANEL (CC13)
ALT: 19 U/L (ref 0–55)
ANION GAP: 6 meq/L (ref 3–11)
AST: 28 U/L (ref 5–34)
Albumin: 3.8 g/dL (ref 3.5–5.0)
Alkaline Phosphatase: 83 U/L (ref 40–150)
BILIRUBIN TOTAL: 0.56 mg/dL (ref 0.20–1.20)
BUN: 11.6 mg/dL (ref 7.0–26.0)
CALCIUM: 9.6 mg/dL (ref 8.4–10.4)
CHLORIDE: 108 meq/L (ref 98–109)
CO2: 25 mEq/L (ref 22–29)
Creatinine: 0.8 mg/dL (ref 0.6–1.1)
Glucose: 85 mg/dl (ref 70–140)
Potassium: 4.6 mEq/L (ref 3.5–5.1)
Sodium: 140 mEq/L (ref 136–145)
Total Protein: 7.5 g/dL (ref 6.4–8.3)

## 2013-12-24 MED ORDER — TAMOXIFEN CITRATE 20 MG PO TABS: 20.0000 mg | ORAL_TABLET | Freq: Every day | ORAL | Status: DC

## 2013-12-26 ENCOUNTER — Encounter: Payer: Self-pay | Admitting: Hematology

## 2013-12-26 NOTE — Progress Notes (Signed)
Wendy Gates NOTE  Patient Care Team: Candace Wyline Copas, MD as PCP - General (Family Medicine) Gyn: Crawford Givens, MD [facility] Surgeon: Luella Cook III, MD   CHIEF COMPLAINTS/PURPOSE OF CONSULTATION:   LCIS Right breast s/p Lumpectomy to discuss chemoprevention with Tamoxifen.  HISTORY OF PRESENTING ILLNESS:   Wendy Gates 48 y.o. Africo-American female who lives in [place] is here because of diagnosis of right breast LCIS. Patient is premenopausal and also have a family history of breast cancer as mother died of breast cancer. She underwent a screening mammogram on 04/28/2013 at the breast Center. The breasts are heterogeneously dense. The right breast some calcifications were noted. In the left breast, no mass or malignant type calcifications are identified.      Patient then came for additional views of the right breast on 05/11/2013. On spot magnification images it showed a 1.3 cm group of amorphous microcalcifications over the inner mid to upper right breast. A stereotactic core needle biopsy with was recommended.      Patient then underwent a Stereotactic-guided biopsy procedure, it was a code needle biopsy of microcalcifications in the inner upper right breast using a medial to lateral approach. At the conclusion of the procedure, a clip was deployed in to the biopsy cavity. The pathology from this accession number S. [id number] was consistent with lobular neoplasia and atypical lobular hyperplasia. Surgical excision was recommended and patient referred to Dr. Marlou Starks. Dr. Marlou Starks performed needle localization lumpectomy on 07/05/2013 and pathology accession number [id number] was consistent with LCIS or lobular carcinoma in situ. There were also fibrocystic changes with usual ductal hyperplasia and calcifications. Radial scar was noted there was a healing biopsy site. Patient is now being referred for consultation to discuss chemopreventive  strategies. She did have an endometrial biopsy performed by Dr Crawford Givens on 08/24/2013 showing benign proliferative endometrium. No hyperplasia or malignancy identified. The pathology accession number is [id number]. This was done for indication of menorrhagia. Patient has now completed her family and has been some discussion about doing a hysterectomy. Patient has an appointment to see Dr. Marlou Starks in September of 2015.  I reviewed her records extensively and collaborated the history with the patient.  SUMMARY OF ONCOLOGIC HISTORY:  No history exists.    In terms of breast cancer risk profile:  She menarched at early age of 7 and she is premenopausal.  She had1pregnancy, 2 miscarriages her first child was born at age 69. She did breast-fed her child for approximately 8 months.  She did not receive birth control pills.  She was never exposed to fertility medications or hormone replacement therapy.  She has positive family history of Breast cancer. Her mother was diagnosed in late forties (10's) with early stage BC and then had a relapse and she died when she was about 43 years old. All sisters (4) have dense breasts and also fibrocystic disease. Their ages are [phone] and 86. Two of them underwent biopsies which were negative.   MEDICAL HISTORY:  Past Medical History  Diagnosis Date  . Arthritis   . Medical history non-contributory     SURGICAL HISTORY: Past Surgical History  Procedure Laterality Date  . Tonsillectomy    . Wisdom tooth extraction    . Breast lumpectomy with needle localization Right 07/05/2013    Procedure: RIGHT BREAST WIRE LOCALIZATION LUMPECTOMY ;  Surgeon: Merrie Roof, MD;  Location: [facility];  Service: General;  Laterality: Right;  SOCIAL HISTORY: History   Social History  . Marital Status: Married    Spouse Name: N/A    Number of Children: N/A  . Years of Education: N/A   Occupational History  . Not on file.   Social History  Main Topics  . Smoking status: Never Smoker   . Smokeless tobacco: Not on file  . Alcohol Use: No  . Drug Use: No  . Sexual Activity: Not on file   Other Topics Concern  . Not on file   Social History Narrative  . 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  FAMILY HISTORY: Family History  Problem Relation Age of Onset  . Breast cancer Mother     and aunt  . Heart attack Father    She has positive family history of Breast cancer. Her mother was diagnosed in late forties (98's) with early stage BC and then had a relapse and she died when she was about 70 years old. All sisters (4) have dense breasts and also fibrocystic disease. Their ages are [phone] and 58. Two of them underwent biopsies which were negative. No history of ovarian, uterine of GI cancer.   ALLERGIES:  has No Known Allergies.  MEDICATIONS:  Current Outpatient Prescriptions  Medication Sig Dispense Refill  . cholecalciferol (VITAMIN D) 1000 UNITS tablet Take 1,000 Units by mouth daily.      . Multiple Vitamins-Minerals (MULTIVITAMIN WITH MINERALS) tablet Take 1 tablet by mouth daily.      . tamoxifen (NOLVADEX) 20 MG tablet Take 1 tablet (20 mg total) by mouth daily.  90 tablet  3   No current facility-administered medications for this visit.    REVIEW OF SYSTEMS:    Constitutional: Denies fevers, chills or abnormal night sweats Eyes: Denies blurriness of vision, double vision or watery eyes Ears, nose, mouth, throat, and face: Denies mucositis or sore throat Respiratory: Denies cough, dyspnea or wheezes Cardiovascular: Denies palpitation, chest discomfort or lower extremity swelling Gastrointestinal:  Denies nausea, heartburn or change in bowel habits Skin: Denies abnormal skin rashes Lymphatics: Denies new lymphadenopathy or easy bruising Neurological:Denies numbness, tingling or new weaknesses Behavioral/Psych: Mood is stable, no new changes  All other systems were reviewed with  the patient and are negative.  PHYSICAL EXAMINATION: ECOG PERFORMANCE STATUS: 0, KPS 100  Filed Vitals:   12/24/13 1059  BP: 126/75  Pulse: 67  Temp: 98.4 F (36.9 C)  Resp: 20   Filed Weights   12/24/13 1059  Weight: 154 lb 9.6 oz (70.126 kg)    GENERAL:alert, no distress and comfortable SKIN: skin color, texture, turgor are normal, no rashes or significant lesions EYES: normal, conjunctiva are pink and non-injected, sclera clear OROPHARYNX:no exudate, no erythema and lips, buccal mucosa, and tongue normal  NECK: supple, thyroid normal size, non-tender, without nodularity LYMPH:  no palpable lymphadenopathy in the cervical, axillary or inguinal LUNGS: clear to auscultation and percussion with normal breathing effort HEART: regular rate & rhythm and no murmurs and no lower extremity edema ABDOMEN:abdomen soft, non-tender and normal bowel sounds Musculoskeletal:no cyanosis of digits and no clubbing  PSYCH: alert & oriented x 3 with fluent speech NEURO: no focal motor/sensory deficits  LABORATORY DATA:  I have reviewed the data as listed Lab Results  Component Value Date   WBC 4.6 12/24/2013   HGB 10.1* 12/24/2013   HCT 33.0* 12/24/2013   MCV 71.9* 12/24/2013   PLT 215 12/24/2013   Lab Results  Component Value  Date   NA 140 12/24/2013   K 4.6 12/24/2013   CO2 25 12/24/2013    RADIOGRAPHIC STUDIES: I have personally reviewed the radiological images that is Mammograms and [place] results.  Pathology Data:  Reviewed and as listed in HPI.   ASSESSMENT/PLAN:  1. Elexis is a 48 years old premenopausal female with a diagnosis of right breast LCIS. She she initially had an abnormal mammogram leading to a biopsy and then a lumpectomy procedure on 07/05/2013 by Dr Autumn Messing.   2. LCIS is a noninvasive lesion that arises from the lobules and terminal duct of the breast. It is a risk factor for invasive carcinoma and may be a direct precursor lesion. Retrospective series report a high  risk of developing ipsilateral and contralateral invasive breast cancer and a greater likelihood of developing invasive lobular carcinoma rather than invasive ductal carcinoma.   3. The relative risk of developing an invasive cancer in woman with LCIS is approximately twofold higher than for woman without LCIS. The absolute risk is approximately 1% per year and appears to be lifelong. In a NSABP study of 180 woman with LCIS, 5% developed an ipsilateral invasive carcinoma after 12 years of followup while similar fraction 5.6% developed a contralateral invasive tumor.   4. The NSABP tamoxifen prevention P1 trial had 826 patient with LCIS after 7 years of followup,and the risk for development of invasive breast cancer in the placebo group was 1.1% and was twice that of tamoxifen group.   5. In a series of cases of lobular neoplasia reported to the [place] between 1973 and 1998 7% developed invasive Carcinoma by 10 years.   6. Patient with LCIS are candidates for breast cancer chemopreventive therapy OR surveillance alone without medical therapy is an option. Tamoxifen is  the mainstay of therapy in premenopausal women but now aromatase inhibitors such as Aromasin and Arimidex are approved for postmenopausal women.Aromatase inhibitors offer a better safety profile and more efficacy. Phase-3 results from the MAP.3 trial showed that the aromatase inhibitor exemestane reduced the risk for developing breast cancer by 65% in women at high-risk.   7. Since Alaynna is premenopausal, I recommended tamoxifen as the chemoprevention strategy. This is also more relevant as she has a positive family history of breast cancer. The side effects of tamoxifen were discussed. They include but are not limited to fatigue, hot flashes, vaginal dryness, decrease in sexual libido, cataracts, mood changes, 2-3% risk for a DVT or PE and 2-3% risk for endometrial cancer. I usually recommend thromboprophylaxis with a baby aspirin  to decrease the risk for DVT. If patient and her gynecologist decides on doing a hysterectomy, we can certainly consider the option for use of Aromasin or Arimidex.  8. Patient is slightly anemic with a hemoglobin of 10.1 g and MCV of 71. I encouraged her to take a multivitamin, iron and vitamin C.  9. patient will be due for her mammogram in November 2015 and from this point onwards she should get a dedicated breast exam every 6 months. I will see her back in the clinic in 6 months. If she has any problems with tamoxifen, she will notify us. The rational for tamoxifen is to cut down the risk for ipsilateral or contralateral breast cancer as well as in situ lesions by 50%.   All questions were answered. The patient knows to call the clinic with any problems, questions or concerns. I spent 30 minutes counseling the patient face to face. The total time  spent in the appointment was 60 minutes and more than 50% was on counseling.    Bernadene Bell, MD Medical Hematologist/Oncologist [place] Pager: [phone] Office No: [phone]

## 2013-12-29 ENCOUNTER — Telehealth: Payer: Self-pay | Admitting: Hematology

## 2013-12-29 NOTE — Telephone Encounter (Signed)
Lvm advising Nov mammo at [facility] and 6 mos f/u appt for Jan 2016. Mailed appt calendars.

## 2014-02-24 ENCOUNTER — Ambulatory Visit (INDEPENDENT_AMBULATORY_CARE_PROVIDER_SITE_OTHER): Payer: BC Managed Care – PPO | Admitting: General Surgery

## 2014-04-11 ENCOUNTER — Encounter: Payer: Self-pay | Admitting: Hematology

## 2014-05-02 ENCOUNTER — Encounter (INDEPENDENT_AMBULATORY_CARE_PROVIDER_SITE_OTHER): Payer: Self-pay

## 2014-05-02 ENCOUNTER — Ambulatory Visit
Admission: RE | Admit: 2014-05-02 | Discharge: 2014-05-02 | Disposition: A | Discharge status: Home / Self Care | Payer: BC Managed Care – PPO | Source: Ambulatory Visit | Attending: Hematology | Admitting: Hematology

## 2014-05-02 DIAGNOSIS — C50919 Malignant neoplasm of unspecified site of unspecified female breast: Secondary | ICD-10-CM

## 2014-06-28 ENCOUNTER — Other Ambulatory Visit (HOSPITAL_BASED_OUTPATIENT_CLINIC_OR_DEPARTMENT_OTHER): Payer: BLUE CROSS/BLUE SHIELD

## 2014-06-28 DIAGNOSIS — D0590 Unspecified type of carcinoma in situ of unspecified breast: Secondary | ICD-10-CM

## 2014-06-28 DIAGNOSIS — C50919 Malignant neoplasm of unspecified site of unspecified female breast: Secondary | ICD-10-CM

## 2014-06-28 LAB — CBC WITH DIFFERENTIAL/PLATELET
BASO%: 0.7 % (ref 0.0–2.0)
BASOS ABS: 0 10*3/uL (ref 0.0–0.1)
EOS ABS: 0.1 10*3/uL (ref 0.0–0.5)
EOS%: 1.5 % (ref 0.0–7.0)
HEMATOCRIT: 33.4 % — AB (ref 34.8–46.6)
HGB: 9.8 g/dL — ABNORMAL LOW (ref 11.6–15.9)
LYMPH%: 55.7 % — ABNORMAL HIGH (ref 14.0–49.7)
MCH: 20.8 pg — ABNORMAL LOW (ref 25.1–34.0)
MCHC: 29.3 g/dL — ABNORMAL LOW (ref 31.5–36.0)
MCV: 70.9 fL — ABNORMAL LOW (ref 79.5–101.0)
MONO#: 0.3 10*3/uL (ref 0.1–0.9)
MONO%: 6.5 % (ref 0.0–14.0)
NEUT#: 1.5 10*3/uL (ref 1.5–6.5)
NEUT%: 35.6 % — ABNORMAL LOW (ref 38.4–76.8)
Platelets: 205 10*3/uL (ref 145–400)
RBC: 4.71 10*6/uL (ref 3.70–5.45)
RDW: 16.5 % — AB (ref 11.2–14.5)
WBC: 4.3 10*3/uL (ref 3.9–10.3)
lymph#: 2.4 10*3/uL (ref 0.9–3.3)

## 2014-06-28 LAB — COMPREHENSIVE METABOLIC PANEL (CC13)
ALT: 14 U/L (ref 0–55)
AST: 22 U/L (ref 5–34)
Albumin: 3.7 g/dL (ref 3.5–5.0)
Alkaline Phosphatase: 77 U/L (ref 40–150)
Anion Gap: 9 mEq/L (ref 3–11)
BUN: 6.8 mg/dL — ABNORMAL LOW (ref 7.0–26.0)
CALCIUM: 8.7 mg/dL (ref 8.4–10.4)
CHLORIDE: 107 meq/L (ref 98–109)
CO2: 24 mEq/L (ref 22–29)
CREATININE: 0.8 mg/dL (ref 0.6–1.1)
EGFR: >90 mL/min/{1.73_m2} (ref >90)
Glucose: 99 mg/dl (ref 70–140)
Potassium: 3.6 mEq/L (ref 3.5–5.1)
Sodium: 140 mEq/L (ref 136–145)
Total Bilirubin: 0.29 mg/dL (ref 0.20–1.20)
Total Protein: 7.2 g/dL (ref 6.4–8.3)

## 2014-07-05 ENCOUNTER — Ambulatory Visit (HOSPITAL_BASED_OUTPATIENT_CLINIC_OR_DEPARTMENT_OTHER): Payer: BLUE CROSS/BLUE SHIELD | Admitting: Hematology and Oncology

## 2014-07-05 ENCOUNTER — Telehealth: Payer: Self-pay | Admitting: Hematology and Oncology

## 2014-07-05 VITALS — BP 136/64 | HR 80 | Temp 98.1°F | Resp 16 | Ht 63.0 in | Wt 153.8 lb

## 2014-07-05 DIAGNOSIS — M791 Myalgia: Secondary | ICD-10-CM

## 2014-07-05 DIAGNOSIS — N951 Menopausal and female climacteric states: Secondary | ICD-10-CM

## 2014-07-05 DIAGNOSIS — D0501 Lobular carcinoma in situ of right breast: Secondary | ICD-10-CM

## 2014-07-05 NOTE — Telephone Encounter (Signed)
per pof to sch pt appt-gave pt copy of sch °

## 2014-07-05 NOTE — Assessment & Plan Note (Addendum)
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 Hot flashes 2. Mild myalgias Otherwise patient appears to be tolerating tamoxifen fairly well  Breast cancer surveillance: 1. Breast exam 07/05/2014 is normal 2. Mammograms 05/02/2014 are normal  Return to clinic in 6 months for follow-up. After the first 2 years we can see her on annual basis.

## 2014-07-05 NOTE — Progress Notes (Signed)
Patient Care Team: Candace Wyline Copas, MD as PCP - General (Family Medicine)  DIAGNOSIS:LCIS  Current treatment: Tamoxifen 20 mg daily since February 2015 for breast cancer risk reduction CHIEF COMPLIANT: follow-up of LCIS on tamoxifen  INTERVAL HISTORY: Wendy Gates is a 49 year old lady with above-mentioned history of LCIS who underwent lumpectomy followed by adjuvant tamoxifen. She is tolerating tamoxifen extremely well other than occasional hot flashes or myalgias. Denies any new lumps or nodules in the breasts. She felt something under the left axilla but she does not feel it anymore. She had a mammogram in November which was normal.  REVIEW OF SYSTEMS:   Constitutional: Denies fevers, chills or abnormal weight loss Eyes: Denies blurriness of vision Ears, nose, mouth, throat, and face: Denies mucositis or sore throat Respiratory: Denies cough, dyspnea or wheezes Cardiovascular: Denies palpitation, chest discomfort or lower extremity swelling Gastrointestinal:  Denies nausea, heartburn or change in bowel habits Skin: Denies abnormal skin rashes Lymphatics: Denies new lymphadenopathy or easy bruising Neurological:Denies numbness, tingling or new weaknesses Behavioral/Psych: Mood is stable, no new changes  Breast:  denies any pain or lumps or nodules in either breasts All other systems were reviewed with the patient and are negative.  I have reviewed the past medical history, past surgical history, social history and family history with the patient and they are unchanged from previous note.  ALLERGIES:  has No Known Allergies.  MEDICATIONS:  Current Outpatient Prescriptions  Medication Sig Dispense Refill  . cholecalciferol (VITAMIN D) 1000 UNITS tablet Take 1,000 Units by mouth daily.    Marland Kitchen docusate sodium (COLACE) 100 MG capsule Take 100 mg by mouth daily as needed for mild constipation.    . Iron TABS Take 1 tablet by mouth daily. Per pt, she takes iron tablets during the  time of her menstrual cycle    . Multiple Vitamins-Minerals (MULTIVITAMIN WITH MINERALS) tablet Take 1 tablet by mouth daily.    . tamoxifen (NOLVADEX) 20 MG tablet Take 1 tablet (20 mg total) by mouth daily. 90 tablet 3   No current facility-administered medications for this visit.    PHYSICAL EXAMINATION: ECOG PERFORMANCE STATUS: 0 - Asymptomatic  Filed Vitals:   07/05/14 1208  BP: 136/64  Pulse: 80  Temp: 98.1 F (36.7 C)  Resp: 16   Filed Weights   07/05/14 1208  Weight: 153 lb 12.8 oz (69.763 kg)    GENERAL:alert, no distress and comfortable SKIN: skin color, texture, turgor are normal, no rashes or significant lesions EYES: normal, Conjunctiva are pink and non-injected, sclera clear OROPHARYNX:no exudate, no erythema and lips, buccal mucosa, and tongue normal  NECK: supple, thyroid normal size, non-tender, without nodularity LYMPH:  no palpable lymphadenopathy in the cervical, axillary or inguinal LUNGS: clear to auscultation and percussion with normal breathing effort HEART: regular rate & rhythm and no murmurs and no lower extremity edema ABDOMEN:abdomen soft, non-tender and normal bowel sounds Musculoskeletal:no cyanosis of digits and no clubbing  NEURO: alert & oriented x 3 with fluent speech, no focal motor/sensory deficits BREAST: No palpable masses or nodules in either right or left breasts. No palpable axillary supraclavicular or infraclavicular adenopathy no breast tenderness or nipple discharge. (exam performed in the presence of a chaperone)  LABORATORY DATA:  I have reviewed the data as listed   Chemistry      Component Value Date/Time   NA 140 06/28/2014 1410   K 3.6 06/28/2014 1410   CO2 24 06/28/2014 1410   BUN 6.8* 06/28/2014 1410   CREATININE  0.8 06/28/2014 1410      Component Value Date/Time   CALCIUM 8.7 06/28/2014 1410   ALKPHOS 77 06/28/2014 1410   AST 22 06/28/2014 1410   ALT 14 06/28/2014 1410   BILITOT 0.29 06/28/2014 1410        Lab Results  Component Value Date   WBC 4.3 06/28/2014   HGB 9.8* 06/28/2014   HCT 33.4* 06/28/2014   MCV 70.9* 06/28/2014   PLT 205 06/28/2014   NEUTROABS 1.5 06/28/2014     RADIOGRAPHIC STUDIES: I have personally reviewed the radiology reports and agreed with their findings. Mammogram 05/02/2014 is normal  ASSESSMENT & PLAN:  Neoplasm of right breast, 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 Hot flashes 2. Mild myalgias Otherwise patient appears to be tolerating tamoxifen fairly well  Breast cancer surveillance: 1. Breast exam 07/05/2014 is normal 2. Mammograms 05/02/2014 are normal  Return to clinic in 6 months for follow-up. After the first 2 years we can see her on annual basis.     No orders of the defined types were placed in this encounter.   The patient has a good understanding of the overall plan. she agrees with it. She will call with any problems that may develop before her next visit here.   Rulon Eisenmenger, MD

## 2014-08-10 ENCOUNTER — Other Ambulatory Visit: Payer: Self-pay | Admitting: Obstetrics and Gynecology

## 2014-09-13 ENCOUNTER — Other Ambulatory Visit (HOSPITAL_COMMUNITY): Payer: Self-pay | Admitting: Obstetrics and Gynecology

## 2014-09-13 ENCOUNTER — Other Ambulatory Visit: Payer: Self-pay

## 2014-09-13 NOTE — H&P (Signed)
Wendy Gates is a 49 y.o.  female  P 1-0-2-1 presents for hysterectomy because of irregular bleeding and uterine fibroids.  For many years the patient has had a menstrual flow that lasts for 8 days with pad change every 2 hours accompanied by cramping that she rates as 8/10 on a 10 point pain scale.  Fortunately she will get some relief from the cramping with Ibuprofen or Tylenol (reducing pain to 4/10).  She denies any urinary tract changes, dyspareunia or back pain,  but since beginning iron supplementation she has become constipated.  A year ago the patient was diagnosed with breast cancer and underwent a right lumpectomy.  Subsequently she was placed on Tamoxifen and since that time has been having regular spotting in addition to her monthly menstrual flow.  Along with this spotting she will also experience some cramping (rated at 5/10 on a 10 point pain scale).  A CBC done in February 2016 showed a hemoglobin/hematocrit = 9.6/30.5 respectively however, CMET and TSH were normal. A pelvic ultrasound in February 2015 showed: uterus-9.69 x 10.8 x 9.34 cm with #4 fibroids: right posterior-4.6 x 5.0 x 5.3 cm, right anterior-4.1 x 3.1 x 3.4 cm right fundal-2.9 x 2.3 x 3.0 cm and midline-anterior fibroid with possible sub-mucosal component-2.8 x 2.9 x 3.4 cm; both ovaries appeared normal on that study.   A review of both medical and surgical management options were given to the patient, however, after careful consideration, the patient has chosen definitive therapy in the form of hysterectomy.   Past Medical History  OB History: G: 3;  P: 1-0-2-1;  SVB 2007  GYN History: menarche:49 YO   LMP: 09/02/2014    Contracepton vasectomy  The patient denies history of sexually transmitted disease.  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 aspirin EC 81 MG tablet Take 81 mg by mouth daily.  . cholecalciferol (VITAMIN D) 1000 UNITS tablet Take 1,000 Units by mouth daily.  Marland Kitchen docusate sodium (COLACE) 100 MG capsule Take 100 mg by mouth daily as needed for mild constipation.  . Iron TABS Take 1 tablet by mouth daily. Per pt, she takes iron tablets during the time of her menstrual cycle  . Multiple Vitamins-Minerals (MULTIVITAMIN WITH MINERALS) tablet Take 1 tablet by mouth daily.  . naproxen (NAPROSYN) 500 MG tablet Take 500 mg by mouth at bedtime.  . tamoxifen (NOLVADEX) 20 MG tablet Take 1 tablet (20 mg total) by mouth daily.    No Known Allergies   Denies sensitivity to peanuts, shellfish, soy, latex or adhesives.   ROS: Admits to pedal edema, constipation due to iron supplementation and skin changes due to sarcoidosis,  but denies headache, vision changes, nasal congestion, dysphagia, tinnitus, dizziness, hoarseness, cough,  chest pain, shortness of breath, nausea, vomiting, diarrhea, urinary frequency, urgency  dysuria, hematuria, vaginitis symptoms, pelvic pain, easy bruising,  myalgias, arthralgias, skin rashes, unexplained weight loss and except as is mentioned in the history of present illness, patient's review of systems is otherwise negative.   Physical Exam  Bp: 114/78  P: 68    R: 28  Temperature: 98.3 degrees F  Height: 5\' 2"   Weight: 160 lbs.  Neck: supple without masses or thyromegaly Lungs: clear to auscultation Heart: regular rate and rhythm Abdomen: soft, non-tender and no organomegaly Pelvic:EGBUS- wnl; vagina-normal rugae; uterus-10-12 weeks size, irregular and firm,  cervix without  lesions or motion tenderness; adnexae-no tenderness or masses Extremities:  no clubbing, cyanosis or edema  Endometrial Biopsy: no malignancy, atypia or hyperplasia   Assesment: Menometrorrhagia           Fibroids          Tamoxifen Therapy   Disposition:  A discussion was held with patient regarding the indication for her procedure(s) along with the risks, which include but are not limited to: reaction to anesthesia, damage to adjacent organs, infection,  excessive bleeding and the possible need for an open abdominal incision.  The patient verbalize understanding of these risks and has consented to proceed with a Laparoscopically Assisted Vaginal Hysterectomy, Bilateral Salpingectomy, Cystoscopy with a Possible Total Abdominal Hysterectomy with Blateral Oophorectomy at [facility] on September 19, 2014 at 1 p.m.  CSN# [id number]   Avni Traore J. Florene Glen, PA-C  for Dr. Franklyn Lor. Dillard

## 2014-09-15 NOTE — Patient Instructions (Signed)
Your procedure is scheduled on: Monday, September 19, 2014  Enter through the Main Entrance of [facility] at: 11:30 A.M.  Pick up the phone at the desk and dial [phone].  Call this number if you have problems the morning of surgery: [phone].  Remember: Do NOT eat food: AFTER MIDNIGHT SUNDAY Do NOT drink clear liquids after:  AFTER 9:00 A.M. DAY OF SURGERY Take these medicines the morning of surgery with a SIP OF WATER: TAMOXIFEN  *TAKE ASPIRIN AS DIRECTED BY SURGEON.  *STOP ALL VITAMINS AND HERBAL SUPPLEMENTS AT THIS TIME.  Do NOT wear jewelry (body piercing), metal hair clips/bobby pins, make-up, or nail polish. Do NOT wear lotions, powders, or perfumes.  You may wear deoderant. Do NOT shave for 48 hours prior to surgery. Do NOT bring valuables to the hospital. Contacts, dentures, or bridgework may not be worn into surgery. Leave suitcase in car.  After surgery it may be brought to your room.  For patients admitted to the hospital, checkout time is 11:00 AM the day of discharge.

## 2014-09-16 ENCOUNTER — Encounter (HOSPITAL_COMMUNITY)
Admission: RE | Admit: 2014-09-16 | Discharge: 2014-09-16 | Disposition: A | Discharge status: Home / Self Care | Payer: BLUE CROSS/BLUE SHIELD | Source: Ambulatory Visit | Attending: Obstetrics and Gynecology | Admitting: Obstetrics and Gynecology

## 2014-09-16 ENCOUNTER — Inpatient Hospital Stay (HOSPITAL_COMMUNITY)
Admission: RE | Admit: 2014-09-16 | Discharge: 2014-09-16 | Disposition: A | Discharge status: Home / Self Care | Payer: BLUE CROSS/BLUE SHIELD | Source: Ambulatory Visit

## 2014-09-16 ENCOUNTER — Encounter (HOSPITAL_COMMUNITY): Payer: Self-pay

## 2014-09-16 DIAGNOSIS — C50912 Malignant neoplasm of unspecified site of left female breast: Secondary | ICD-10-CM | POA: Diagnosis not present

## 2014-09-16 DIAGNOSIS — D869 Sarcoidosis, unspecified: Secondary | ICD-10-CM | POA: Diagnosis not present

## 2014-09-16 DIAGNOSIS — D649 Anemia, unspecified: Secondary | ICD-10-CM | POA: Diagnosis not present

## 2014-09-16 DIAGNOSIS — N920 Excessive and frequent menstruation with regular cycle: Secondary | ICD-10-CM | POA: Diagnosis present

## 2014-09-16 DIAGNOSIS — Z7982 Long term (current) use of aspirin: Secondary | ICD-10-CM | POA: Diagnosis not present

## 2014-09-16 DIAGNOSIS — D259 Leiomyoma of uterus, unspecified: Secondary | ICD-10-CM | POA: Diagnosis not present

## 2014-09-16 DIAGNOSIS — Z79810 Long term (current) use of selective estrogen receptor modulators (SERMs): Secondary | ICD-10-CM | POA: Diagnosis not present

## 2014-09-16 DIAGNOSIS — G43909 Migraine, unspecified, not intractable, without status migrainosus: Secondary | ICD-10-CM | POA: Diagnosis not present

## 2014-09-16 HISTORY — DX: Malignant (primary) neoplasm, unspecified: C80.1

## 2014-09-16 LAB — COMPREHENSIVE METABOLIC PANEL
ALBUMIN: 3.8 g/dL (ref 3.5–5.2)
ALT: 19 U/L (ref 0–35)
AST: 29 U/L (ref 0–37)
Alkaline Phosphatase: 83 U/L (ref 39–117)
Anion gap: 6 (ref 5–15)
BUN: 9 mg/dL (ref 6–23)
CALCIUM: 9.2 mg/dL (ref 8.4–10.5)
CHLORIDE: 106 mmol/L (ref 96–112)
CO2: 27 mmol/L (ref 19–32)
CREATININE: 0.78 mg/dL (ref 0.50–1.10)
Glucose, Bld: 90 mg/dL (ref 70–99)
Potassium: 3.8 mmol/L (ref 3.5–5.1)
Sodium: 139 mmol/L (ref 135–145)
Total Bilirubin: 0.5 mg/dL (ref 0.3–1.2)
Total Protein: 7.4 g/dL (ref 6.0–8.3)

## 2014-09-16 LAB — CBC
HEMATOCRIT: 31 % — AB (ref 36.0–46.0)
HEMOGLOBIN: 9.7 g/dL — AB (ref 12.0–15.0)
MCH: 22.5 pg — ABNORMAL LOW (ref 26.0–34.0)
MCHC: 31.3 g/dL (ref 30.0–36.0)
MCV: 71.9 fL — ABNORMAL LOW (ref 78.0–100.0)
Platelets: 255 10*3/uL (ref 150–400)
RBC: 4.31 MIL/uL (ref 3.87–5.11)
RDW: 20.1 % — AB (ref 11.5–15.5)
WBC: 5 10*3/uL (ref 4.0–10.5)

## 2014-09-16 NOTE — Patient Instructions (Addendum)
Your procedure is scheduled on:09/19/14  Enter through the Main Entrance at :33 am Pick up desk phone and dial [phone] and inform us of your arrival.  Please call [phone] if you have any problems the morning of surgery.  Remember: Do not eat food after midnight:Sunday Clear liquids are ok until:9am on Monday   You may brush your teeth the morning of surgery.  Take these meds the morning of surgery with a sip of water:Tamoxifen  DO NOT wear jewelry, eye make-up, lipstick,body lotion, or dark fingernail polish.  (Polished toes are ok) You may wear deodorant.  If you are to be admitted after surgery, leave suitcase in car until your room has been assigned. Patients discharged on the day of surgery will not be allowed to drive home. Wear loose fitting, comfortable clothes for your ride home.

## 2014-09-18 MED ORDER — DEXTROSE 5 % IV SOLN
2.0000 g | INTRAVENOUS | Status: AC
  Administered 2014-09-19: 2 g via INTRAVENOUS
  Filled 2014-09-18: qty 2

## 2014-09-19 ENCOUNTER — Observation Stay (HOSPITAL_COMMUNITY)
Admission: RE | Admit: 2014-09-19 | Discharge: 2014-09-20 | Disposition: A | Discharge status: Home / Self Care | Payer: BLUE CROSS/BLUE SHIELD | Source: Ambulatory Visit | Attending: Obstetrics and Gynecology | Admitting: Obstetrics and Gynecology

## 2014-09-19 ENCOUNTER — Ambulatory Visit (HOSPITAL_COMMUNITY): Payer: BLUE CROSS/BLUE SHIELD | Admitting: Anesthesiology

## 2014-09-19 ENCOUNTER — Encounter (HOSPITAL_COMMUNITY)
Admission: RE | Disposition: A | Discharge status: Home / Self Care | Payer: Self-pay | Source: Ambulatory Visit | Attending: Obstetrics and Gynecology

## 2014-09-19 DIAGNOSIS — D869 Sarcoidosis, unspecified: Secondary | ICD-10-CM | POA: Insufficient documentation

## 2014-09-19 DIAGNOSIS — G43909 Migraine, unspecified, not intractable, without status migrainosus: Secondary | ICD-10-CM | POA: Insufficient documentation

## 2014-09-19 DIAGNOSIS — D649 Anemia, unspecified: Secondary | ICD-10-CM | POA: Insufficient documentation

## 2014-09-19 DIAGNOSIS — Z79810 Long term (current) use of selective estrogen receptor modulators (SERMs): Secondary | ICD-10-CM | POA: Insufficient documentation

## 2014-09-19 DIAGNOSIS — D259 Leiomyoma of uterus, unspecified: Secondary | ICD-10-CM | POA: Diagnosis not present

## 2014-09-19 DIAGNOSIS — C50912 Malignant neoplasm of unspecified site of left female breast: Secondary | ICD-10-CM | POA: Insufficient documentation

## 2014-09-19 DIAGNOSIS — Z7982 Long term (current) use of aspirin: Secondary | ICD-10-CM | POA: Insufficient documentation

## 2014-09-19 DIAGNOSIS — D219 Benign neoplasm of connective and other soft tissue, unspecified: Secondary | ICD-10-CM | POA: Diagnosis present

## 2014-09-19 HISTORY — PX: LAPAROSCOPIC BILATERAL SALPINGECTOMY: SHX5889

## 2014-09-19 HISTORY — PX: CYSTOSCOPY: SHX5120

## 2014-09-19 HISTORY — PX: LAPAROSCOPIC ASSISTED VAGINAL HYSTERECTOMY: SHX5398

## 2014-09-19 LAB — TYPE AND SCREEN
ABO/RH(D): B POS
ANTIBODY SCREEN: NEGATIVE

## 2014-09-19 LAB — PREGNANCY, URINE: Preg Test, Ur: NEGATIVE

## 2014-09-19 LAB — ABO/RH: ABO/RH(D): B POS

## 2014-09-19 SURGERY — HYSTERECTOMY, VAGINAL, LAPAROSCOPY-ASSISTED
Anesthesia: General | Site: Bladder

## 2014-09-19 MED ORDER — OXYCODONE HCL 5 MG/5ML PO SOLN: 5.0000 mg | Freq: Once | ORAL | Status: DC | PRN

## 2014-09-19 MED ORDER — SCOPOLAMINE 1 MG/3DAYS TD PT72
MEDICATED_PATCH | TRANSDERMAL | Status: AC
  Filled 2014-09-19: qty 1

## 2014-09-19 MED ORDER — DEXAMETHASONE SODIUM PHOSPHATE 10 MG/ML IJ SOLN
INTRAMUSCULAR | Status: DC | PRN
  Administered 2014-09-19: 5 mg via INTRAVENOUS

## 2014-09-19 MED ORDER — LACTATED RINGERS IV SOLN
INTRAVENOUS | Status: DC
  Administered 2014-09-19 (×3): via INTRAVENOUS

## 2014-09-19 MED ORDER — DIPHENHYDRAMINE HCL 50 MG/ML IJ SOLN: 12.5000 mg | Freq: Four times a day (QID) | INTRAMUSCULAR | Status: DC | PRN

## 2014-09-19 MED ORDER — NALOXONE HCL 0.4 MG/ML IJ SOLN: 0.4000 mg | INTRAMUSCULAR | Status: DC | PRN

## 2014-09-19 MED ORDER — NEOSTIGMINE METHYLSULFATE 10 MG/10ML IV SOLN
INTRAVENOUS | Status: AC
  Filled 2014-09-19: qty 1

## 2014-09-19 MED ORDER — MIDAZOLAM HCL 2 MG/2ML IJ SOLN
INTRAMUSCULAR | Status: DC | PRN
  Administered 2014-09-19: 2 mg via INTRAVENOUS

## 2014-09-19 MED ORDER — ONDANSETRON HCL 4 MG PO TABS: 4.0000 mg | ORAL_TABLET | Freq: Three times a day (TID) | ORAL | Status: DC | PRN

## 2014-09-19 MED ORDER — LIDOCAINE HCL (CARDIAC) 20 MG/ML IV SOLN
INTRAVENOUS | Status: AC
  Filled 2014-09-19: qty 5

## 2014-09-19 MED ORDER — LACTATED RINGERS IR SOLN
Status: DC | PRN
  Administered 2014-09-19: 3000 mL

## 2014-09-19 MED ORDER — ONDANSETRON HCL 4 MG/2ML IJ SOLN
INTRAMUSCULAR | Status: DC | PRN
Start: 2014-09-19 — End: 2014-09-19
  Administered 2014-09-19: 4 mg via INTRAVENOUS

## 2014-09-19 MED ORDER — OXYCODONE-ACETAMINOPHEN 5-325 MG PO TABS
1.0000 | ORAL_TABLET | ORAL | Status: DC | PRN
  Administered 2014-09-20: 1 via ORAL
  Filled 2014-09-19: qty 1

## 2014-09-19 MED ORDER — METHYLENE BLUE 1 % INJ SOLN
INTRAMUSCULAR | Status: DC | PRN
  Administered 2014-09-19: 4 mL via INTRAVENOUS
  Administered 2014-09-19: 1 mL via INTRAVENOUS

## 2014-09-19 MED ORDER — BUPIVACAINE HCL (PF) 0.25 % IJ SOLN
INTRAMUSCULAR | Status: AC
  Filled 2014-09-19: qty 30

## 2014-09-19 MED ORDER — SCOPOLAMINE 1 MG/3DAYS TD PT72
1.0000 | MEDICATED_PATCH | Freq: Once | TRANSDERMAL | Status: DC
  Administered 2014-09-19: 1.5 mg via TRANSDERMAL

## 2014-09-19 MED ORDER — SODIUM CHLORIDE 0.9 % IJ SOLN
INTRAMUSCULAR | Status: AC
  Filled 2014-09-19: qty 100

## 2014-09-19 MED ORDER — GLYCOPYRROLATE 0.2 MG/ML IJ SOLN
INTRAMUSCULAR | Status: AC
  Filled 2014-09-19: qty 3

## 2014-09-19 MED ORDER — KETOROLAC TROMETHAMINE 30 MG/ML IJ SOLN
INTRAMUSCULAR | Status: DC | PRN
  Administered 2014-09-19: 30 mg via INTRAVENOUS

## 2014-09-19 MED ORDER — VASOPRESSIN 20 UNIT/ML IV SOLN
INTRAVENOUS | Status: DC | PRN
  Administered 2014-09-19: 30 mL via INTRAMUSCULAR

## 2014-09-19 MED ORDER — DEXAMETHASONE SODIUM PHOSPHATE 10 MG/ML IJ SOLN
INTRAMUSCULAR | Status: AC
  Filled 2014-09-19: qty 1

## 2014-09-19 MED ORDER — LIDOCAINE HCL (CARDIAC) 20 MG/ML IV SOLN
INTRAVENOUS | Status: DC | PRN
  Administered 2014-09-19: 70 mg via INTRAVENOUS

## 2014-09-19 MED ORDER — SODIUM CHLORIDE 0.9 % IJ SOLN: 9.0000 mL | INTRAMUSCULAR | Status: DC | PRN

## 2014-09-19 MED ORDER — ROCURONIUM BROMIDE 100 MG/10ML IV SOLN
INTRAVENOUS | Status: AC
  Filled 2014-09-19: qty 1

## 2014-09-19 MED ORDER — LIDOCAINE-EPINEPHRINE (PF) 1 %-1:200000 IJ SOLN
INTRAMUSCULAR | Status: AC
  Filled 2014-09-19: qty 10

## 2014-09-19 MED ORDER — DOCUSATE SODIUM 100 MG PO CAPS
100.0000 mg | ORAL_CAPSULE | Freq: Two times a day (BID) | ORAL | Status: DC
  Administered 2014-09-20: 100 mg via ORAL
  Filled 2014-09-19: qty 1

## 2014-09-19 MED ORDER — OXYCODONE HCL 5 MG PO TABS: 5.0000 mg | ORAL_TABLET | Freq: Once | ORAL | Status: DC | PRN

## 2014-09-19 MED ORDER — TAMOXIFEN CITRATE 10 MG PO TABS
20.0000 mg | ORAL_TABLET | Freq: Every day | ORAL | Status: DC
  Administered 2014-09-20: 20 mg via ORAL
  Filled 2014-09-19: qty 2

## 2014-09-19 MED ORDER — ONDANSETRON HCL 4 MG/2ML IJ SOLN: 4.0000 mg | Freq: Four times a day (QID) | INTRAMUSCULAR | Status: DC | PRN

## 2014-09-19 MED ORDER — IBUPROFEN 600 MG PO TABS: 600.0000 mg | ORAL_TABLET | Freq: Four times a day (QID) | ORAL | Status: DC | PRN

## 2014-09-19 MED ORDER — FERROUS SULFATE 325 (65 FE) MG PO TABS
325.0000 mg | ORAL_TABLET | Freq: Two times a day (BID) | ORAL | Status: DC
  Administered 2014-09-20: 325 mg via ORAL
  Filled 2014-09-19: qty 1

## 2014-09-19 MED ORDER — HYDROMORPHONE HCL 1 MG/ML IJ SOLN
INTRAMUSCULAR | Status: AC
  Filled 2014-09-19: qty 1

## 2014-09-19 MED ORDER — HEPARIN SODIUM (PORCINE) 5000 UNIT/ML IJ SOLN
INTRAMUSCULAR | Status: AC
  Filled 2014-09-19: qty 1

## 2014-09-19 MED ORDER — ACETAMINOPHEN 160 MG/5ML PO SOLN: 325.0000 mg | ORAL | Status: DC | PRN

## 2014-09-19 MED ORDER — METHYLENE BLUE 1 % INJ SOLN
INTRAMUSCULAR | Status: AC
  Filled 2014-09-19: qty 10

## 2014-09-19 MED ORDER — NEOSTIGMINE METHYLSULFATE 10 MG/10ML IV SOLN
INTRAVENOUS | Status: DC | PRN
  Administered 2014-09-19: 4 mg via INTRAVENOUS

## 2014-09-19 MED ORDER — FENTANYL CITRATE 0.05 MG/ML IJ SOLN
INTRAMUSCULAR | Status: AC
Start: 2014-09-19 — End: 2014-09-19
  Filled 2014-09-19: qty 5

## 2014-09-19 MED ORDER — MENTHOL 3 MG MT LOZG: 1.0000 | LOZENGE | OROMUCOSAL | Status: DC | PRN

## 2014-09-19 MED ORDER — ONDANSETRON HCL 4 MG/2ML IJ SOLN
INTRAMUSCULAR | Status: AC
  Filled 2014-09-19: qty 2

## 2014-09-19 MED ORDER — ROCURONIUM BROMIDE 100 MG/10ML IV SOLN
INTRAVENOUS | Status: DC | PRN
  Administered 2014-09-19 (×2): 10 mg via INTRAVENOUS
  Administered 2014-09-19: 40 mg via INTRAVENOUS
  Administered 2014-09-19: 10 mg via INTRAVENOUS

## 2014-09-19 MED ORDER — VASOPRESSIN 20 UNIT/ML IV SOLN
INTRAVENOUS | Status: AC
  Filled 2014-09-19: qty 1

## 2014-09-19 MED ORDER — BUPIVACAINE HCL (PF) 0.25 % IJ SOLN
INTRAMUSCULAR | Status: DC | PRN
  Administered 2014-09-19: 7 mL

## 2014-09-19 MED ORDER — LACTATED RINGERS IV SOLN
INTRAVENOUS | Status: DC
  Administered 2014-09-19 – 2014-09-20 (×2): via INTRAVENOUS

## 2014-09-19 MED ORDER — MIDAZOLAM HCL 2 MG/2ML IJ SOLN
INTRAMUSCULAR | Status: AC
  Filled 2014-09-19: qty 2

## 2014-09-19 MED ORDER — HYDROMORPHONE HCL 1 MG/ML IJ SOLN
INTRAMUSCULAR | Status: DC | PRN
  Administered 2014-09-19: 1 mg via INTRAVENOUS

## 2014-09-19 MED ORDER — GLYCOPYRROLATE 0.2 MG/ML IJ SOLN
INTRAMUSCULAR | Status: DC | PRN
  Administered 2014-09-19: .9 mg via INTRAVENOUS
  Administered 2014-09-19: 0.2 mg via INTRAVENOUS

## 2014-09-19 MED ORDER — 0.9 % SODIUM CHLORIDE (POUR BTL) OPTIME
TOPICAL | Status: DC | PRN
  Administered 2014-09-19 (×3): 1000 mL

## 2014-09-19 MED ORDER — HYDROMORPHONE 0.3 MG/ML IV SOLN
INTRAVENOUS | Status: DC
  Administered 2014-09-19: 1.59 mg via INTRAVENOUS
  Administered 2014-09-19: 19:00:00 via INTRAVENOUS
  Administered 2014-09-20: 0.599 mg via INTRAVENOUS
  Administered 2014-09-20: 0.6 mg via INTRAVENOUS
  Filled 2014-09-19: qty 25

## 2014-09-19 MED ORDER — DIPHENHYDRAMINE HCL 12.5 MG/5ML PO ELIX: 12.5000 mg | ORAL_SOLUTION | Freq: Four times a day (QID) | ORAL | Status: DC | PRN

## 2014-09-19 MED ORDER — KETOROLAC TROMETHAMINE 30 MG/ML IJ SOLN
INTRAMUSCULAR | Status: AC
  Filled 2014-09-19: qty 1

## 2014-09-19 MED ORDER — PROPOFOL 10 MG/ML IV BOLUS
INTRAVENOUS | Status: AC
  Filled 2014-09-19: qty 20

## 2014-09-19 MED ORDER — HYDROMORPHONE HCL 1 MG/ML IJ SOLN
0.2500 mg | INTRAMUSCULAR | Status: DC | PRN
  Administered 2014-09-19: 0.5 mg via INTRAVENOUS

## 2014-09-19 MED ORDER — PROPOFOL 10 MG/ML IV BOLUS
INTRAVENOUS | Status: DC | PRN
  Administered 2014-09-19: 150 mg via INTRAVENOUS
  Administered 2014-09-19: 30 mg via INTRAVENOUS

## 2014-09-19 MED ORDER — ACETAMINOPHEN 325 MG PO TABS: 325.0000 mg | ORAL_TABLET | ORAL | Status: DC | PRN

## 2014-09-19 MED ORDER — FENTANYL CITRATE 0.05 MG/ML IJ SOLN
INTRAMUSCULAR | Status: DC | PRN
  Administered 2014-09-19: 50 ug via INTRAVENOUS
  Administered 2014-09-19 (×2): 100 ug via INTRAVENOUS

## 2014-09-19 SURGICAL SUPPLY — 66 items
CABLE HIGH FREQUENCY MONO STRZ (ELECTRODE) IMPLANT
CATH ROBINSON RED A/P 16FR (CATHETERS) IMPLANT
CLOTH BEACON ORANGE TIMEOUT ST (SAFETY) ×4 IMPLANT
CONT PATH 16OZ SNAP LID 3702 (MISCELLANEOUS) ×4 IMPLANT
COVER BACK TABLE 60X90IN (DRAPES) ×4 IMPLANT
COVER MAYO STAND STRL (DRAPES) ×1 IMPLANT
DECANTER SPIKE VIAL GLASS SM (MISCELLANEOUS) ×1 IMPLANT
DRAPE SHEET LG 3/4 BI-LAMINATE (DRAPES) ×8 IMPLANT
DRSG COVADERM PLUS 2X2 (GAUZE/BANDAGES/DRESSINGS) ×8 IMPLANT
DRSG OPSITE POSTOP 3X4 (GAUZE/BANDAGES/DRESSINGS) ×1 IMPLANT
DURAPREP 26ML APPLICATOR (WOUND CARE) ×4 IMPLANT
ELECT REM PT RETURN 9FT ADLT (ELECTROSURGICAL) ×4
ELECTRODE REM PT RTRN 9FT ADLT (ELECTROSURGICAL) IMPLANT
EVACUATOR SMOKE 8.L (FILTER) ×8 IMPLANT
FORCEPS CUTTING 33CM 5MM (CUTTING FORCEPS) ×1 IMPLANT
GAUZE PACKING 2X5 YD STRL (GAUZE/BANDAGES/DRESSINGS) IMPLANT
GAUZE SPONGE 4X4 16PLY XRAY LF (GAUZE/BANDAGES/DRESSINGS) ×1 IMPLANT
GAUZE VASELINE 3X9 (GAUZE/BANDAGES/DRESSINGS) IMPLANT
GLOVE BIO SURGEON STRL SZ 6.5 (GLOVE) ×8 IMPLANT
GLOVE BIOGEL PI IND STRL 6.5 (GLOVE) ×3 IMPLANT
GLOVE BIOGEL PI IND STRL 7.0 (GLOVE) ×9 IMPLANT
GLOVE BIOGEL PI INDICATOR 6.5 (GLOVE) ×1
GLOVE BIOGEL PI INDICATOR 7.0 (GLOVE) ×3
LIQUID BAND (GAUZE/BANDAGES/DRESSINGS) ×1 IMPLANT
NEEDLE MAYO .5 CIRCLE (NEEDLE) ×4 IMPLANT
NS IRRIG 1000ML POUR BTL (IV SOLUTION) ×4 IMPLANT
OCCLUDER COLPOPNEUMO (BALLOONS) IMPLANT
PACK LAVH (CUSTOM PROCEDURE TRAY) ×4 IMPLANT
PACK ROBOTIC GOWN (GOWN DISPOSABLE) ×4 IMPLANT
PAD POSITIONER PINK NONSTERILE (MISCELLANEOUS) ×4 IMPLANT
PENCIL BUTTON HOLSTER BLD 10FT (ELECTRODE) ×1 IMPLANT
PROTECTOR NERVE ULNAR (MISCELLANEOUS) ×4 IMPLANT
SCISSORS LAP 5X35 DISP (ENDOMECHANICALS) IMPLANT
SET CYSTO W/LG BORE CLAMP LF (SET/KITS/TRAYS/PACK) ×4 IMPLANT
SET IRRIG TUBING LAPAROSCOPIC (IRRIGATION / IRRIGATOR) ×1 IMPLANT
SHEARS HARMONIC ACE PLUS 36CM (ENDOMECHANICALS) IMPLANT
SLEEVE XCEL OPT CAN 5 100 (ENDOMECHANICALS) ×4 IMPLANT
SOLUTION ELECTROLUBE (MISCELLANEOUS) IMPLANT
SPONGE SURGIFOAM ABS GEL 12-7 (HEMOSTASIS) ×2 IMPLANT
STRIP CLOSURE SKIN 1/4X3 (GAUZE/BANDAGES/DRESSINGS) IMPLANT
SUT CHROMIC 0 CT 1 (SUTURE) ×6 IMPLANT
SUT CHROMIC 0 UR 5 27 (SUTURE) IMPLANT
SUT MNCRL AB 3-0 PS2 27 (SUTURE) ×8 IMPLANT
SUT PDS AB 1 CT1 36 (SUTURE) IMPLANT
SUT VIC AB 0 CT1 18XCR BRD8 (SUTURE) ×9 IMPLANT
SUT VIC AB 0 CT1 27 (SUTURE) ×4
SUT VIC AB 0 CT1 27XBRD ANBCTR (SUTURE) ×3 IMPLANT
SUT VIC AB 0 CT1 36 (SUTURE) ×4 IMPLANT
SUT VIC AB 0 CT1 8-18 (SUTURE) ×12
SUT VICRYL 0 ENDOLOOP (SUTURE) IMPLANT
SUT VICRYL 0 TIES 12 18 (SUTURE) ×4 IMPLANT
SUT VICRYL 0 UR6 27IN ABS (SUTURE) IMPLANT
SYR 50ML LL SCALE MARK (SYRINGE) IMPLANT
SYR BULB IRRIGATION 50ML (SYRINGE) ×4 IMPLANT
SYR TB 1ML LUER SLIP (SYRINGE) ×4 IMPLANT
TIP UTERINE 5.1X6CM LAV DISP (MISCELLANEOUS) IMPLANT
TIP UTERINE 6.7X10CM GRN DISP (MISCELLANEOUS) IMPLANT
TIP UTERINE 6.7X6CM WHT DISP (MISCELLANEOUS) IMPLANT
TIP UTERINE 6.7X8CM BLUE DISP (MISCELLANEOUS) IMPLANT
TOWEL OR 17X24 6PK STRL BLUE (TOWEL DISPOSABLE) ×8 IMPLANT
TRAY FOLEY CATH SILVER 14FR (SET/KITS/TRAYS/PACK) ×4 IMPLANT
TROCAR BALLN 12MMX100 BLUNT (TROCAR) ×4 IMPLANT
TROCAR XCEL NON-BLD 11X100MML (ENDOMECHANICALS) IMPLANT
TROCAR XCEL NON-BLD 5MMX100MML (ENDOMECHANICALS) ×4 IMPLANT
TUBING FILTER THERMOFLATOR (ELECTROSURGICAL) ×1 IMPLANT
WATER STERILE IRR 1000ML POUR (IV SOLUTION) ×4 IMPLANT

## 2014-09-19 NOTE — Anesthesia Postprocedure Evaluation (Signed)
  Anesthesia Post-op Note  Patient: Wendy Gates  Procedure(s) Performed: Procedure(s): LAPAROSCOPIC ASSISTED VAGINAL HYSTERECTOMY (N/A) LAPAROSCOPIC BILATERAL SALPINGECTOMY (Bilateral) CYSTOSCOPY (N/A) Patient is awake and responsive. Pain and nausea are reasonably well controlled. Vital signs are stable and clinically acceptable. Oxygen saturation is clinically acceptable. There are no apparent anesthetic complications at this time. Patient is ready for discharge.  Last Vitals:  Filed Vitals:   09/19/14 1745  BP: 118/61  Pulse: 70  Temp:   Resp: 16

## 2014-09-19 NOTE — Anesthesia Procedure Notes (Signed)
Procedure Name: Intubation Date/Time: 09/19/2014 1:57 PM Performed by: Mayer Camel, Vickii Volland A Pre-anesthesia Checklist: Patient identified, Emergency Drugs available, Suction available and Patient being monitored Patient Re-evaluated:Patient Re-evaluated prior to inductionOxygen Delivery Method: Circle system utilized Preoxygenation: Pre-oxygenation with 100% oxygen Intubation Type: IV induction Ventilation: Mask ventilation without difficulty Laryngoscope Size: Miller and 2 Grade View: Grade I Tube type: Oral Placement Confirmation: ETT inserted through vocal cords under direct vision,  positive ETCO2 and breath sounds checked- equal and bilateral Secured at: 21 cm Tube secured with: Tape Dental Injury: Teeth and Oropharynx as per pre-operative assessment

## 2014-09-19 NOTE — Anesthesia Preprocedure Evaluation (Addendum)
Anesthesia Evaluation  Patient identified by MRN, date of birth, ID band Patient awake    Reviewed: Allergy & Precautions, NPO status , Patient's Chart, lab work & pertinent test results  History of Anesthesia Complications Negative for: history of anesthetic complications  Airway Mallampati: II  TM Distance: >3 FB Neck ROM: Full    Dental  (+) Teeth Intact   Pulmonary neg pulmonary ROS,  breath sounds clear to auscultation        Cardiovascular negative cardio ROS  Rhythm:Regular     Neuro/Psych negative neurological ROS  negative psych ROS   GI/Hepatic negative GI ROS, Neg liver ROS,   Endo/Other  negative endocrine ROS  Renal/GU negative Renal ROS     Musculoskeletal   Abdominal   Peds  Hematology  (+) anemia ,   Anesthesia Other Findings Sarcoidosis, no steroids  Reproductive/Obstetrics                            Anesthesia Physical Anesthesia Plan  ASA: II  Anesthesia Plan: General   Post-op Pain Management:    Induction: Intravenous  Airway Management Planned: Oral ETT  Additional Equipment: None  Intra-op Plan:   Post-operative Plan: Extubation in OR  Informed Consent: I have reviewed the patients History and Physical, chart, labs and discussed the procedure including the risks, benefits and alternatives for the proposed anesthesia with the patient or authorized representative who has indicated his/her understanding and acceptance.   Dental advisory given  Plan Discussed with: CRNA and Surgeon  Anesthesia Plan Comments:         Anesthesia Quick Evaluation

## 2014-09-19 NOTE — H&P (View-Only) (Signed)
Wendy Gates is a 49 y.o.  female  P 1-0-2-1 presents for hysterectomy because of irregular bleeding and uterine fibroids.  For many years the patient has had a menstrual flow that lasts for 8 days with pad change every 2 hours accompanied by cramping that she rates as 8/10 on a 10 point pain scale.  Fortunately she will get some relief from the cramping with Ibuprofen or Tylenol (reducing pain to 4/10).  She denies any urinary tract changes, dyspareunia or back pain,  but since beginning iron supplementation she has become constipated.  A year ago the patient was diagnosed with breast cancer and underwent a right lumpectomy.  Subsequently she was placed on Tamoxifen and since that time has been having regular spotting in addition to her monthly menstrual flow.  Along with this spotting she will also experience some cramping (rated at 5/10 on a 10 point pain scale).  A CBC done in February 2016 showed a hemoglobin/hematocrit = 9.6/30.5 respectively however, CMET and TSH were normal. A pelvic ultrasound in February 2015 showed: uterus-9.69 x 10.8 x 9.34 cm with #4 fibroids: right posterior-4.6 x 5.0 x 5.3 cm, right anterior-4.1 x 3.1 x 3.4 cm right fundal-2.9 x 2.3 x 3.0 cm and midline-anterior fibroid with possible sub-mucosal component-2.8 x 2.9 x 3.4 cm; both ovaries appeared normal on that study.   A review of both medical and surgical management options were given to the patient, however, after careful consideration, the patient has chosen definitive therapy in the form of hysterectomy.   Past Medical History  OB History: G: 3;  P: 1-0-2-1;  SVB 2007  GYN History: menarche:49 YO   LMP: 09/02/2014    Contracepton vasectomy  The patient denies history of sexually transmitted disease.  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 aspirin EC 81 MG tablet Take 81 mg by mouth daily.  . cholecalciferol (VITAMIN D) 1000 UNITS tablet Take 1,000 Units by mouth daily.  Marland Kitchen docusate sodium (COLACE) 100 MG capsule Take 100 mg by mouth daily as needed for mild constipation.  . Iron TABS Take 1 tablet by mouth daily. Per pt, she takes iron tablets during the time of her menstrual cycle  . Multiple Vitamins-Minerals (MULTIVITAMIN WITH MINERALS) tablet Take 1 tablet by mouth daily.  . naproxen (NAPROSYN) 500 MG tablet Take 500 mg by mouth at bedtime.  . tamoxifen (NOLVADEX) 20 MG tablet Take 1 tablet (20 mg total) by mouth daily.    No Known Allergies   Denies sensitivity to peanuts, shellfish, soy, latex or adhesives.   ROS: Admits to pedal edema, constipation due to iron supplementation and skin changes due to sarcoidosis,  but denies headache, vision changes, nasal congestion, dysphagia, tinnitus, dizziness, hoarseness, cough,  chest pain, shortness of breath, nausea, vomiting, diarrhea, urinary frequency, urgency  dysuria, hematuria, vaginitis symptoms, pelvic pain, easy bruising,  myalgias, arthralgias, skin rashes, unexplained weight loss and except as is mentioned in the history of present illness, patient's review of systems is otherwise negative.   Physical Exam  Bp: 114/78  P: 68    R: 28  Temperature: 98.3 degrees F  Height: 5\' 2"   Weight: 160 lbs.  Neck: supple without masses or thyromegaly Lungs: clear to auscultation Heart: regular rate and rhythm Abdomen: soft, non-tender and no organomegaly Pelvic:EGBUS- wnl; vagina-normal rugae; uterus-10-12 weeks size, irregular and firm,  cervix without  lesions or motion tenderness; adnexae-no tenderness or masses Extremities:  no clubbing, cyanosis or edema  Endometrial Biopsy: no malignancy, atypia or hyperplasia   Assesment: Menometrorrhagia           Fibroids          Tamoxifen Therapy   Disposition:  A discussion was held with patient regarding the indication for her procedure(s) along with the risks, which include but are not limited to: reaction to anesthesia, damage to adjacent organs, infection,  excessive bleeding and the possible need for an open abdominal incision.  The patient verbalize understanding of these risks and has consented to proceed with a Laparoscopically Assisted Vaginal Hysterectomy, Bilateral Salpingectomy, Cystoscopy with a Possible Total Abdominal Hysterectomy with Blateral Oophorectomy at [facility] on September 19, 2014 at 1 p.m.  CSN# [id number]   Laurell Coalson J. Florene Glen, PA-C  for Dr. Franklyn Lor. Dillard

## 2014-09-19 NOTE — Interval H&P Note (Signed)
History and Physical Interval Note:  09/19/2014 12:57 PM  Wendy Gates  has presented today for surgery, with the diagnosis of Uterine Fibroids  The various methods of treatment have been discussed with the patient and family. After consideration of risks, benefits and other options for treatment, the patient has consented to  Procedure(s): LAPAROSCOPIC ASSISTED VAGINAL HYSTERECTOMY (N/A) as a surgical intervention .  The patient's history has been reviewed, patient examined, no change in status, stable for surgery.  I have reviewed the patient's chart and labs.  Questions were answered to the patient's satisfaction.     [facility]

## 2014-09-19 NOTE — Op Note (Signed)
reop Diagnosis: Symptomatic Fibroids, 58570, poss. [id number]   Postop Diagnosis: Symptomatic Fibroids   Procedure: LAVH, B salpingectmy , Cystoscopy  Anesthesia: General   Anesthesiologist: Dr   Attending: Betsy Coder, MD   Assistant: Earnstine Regal PA  Findings: 12 week size fibroid uterus.  Filmy adhesions over the cecum.  Normal appearing abdominal anatomy  Pathology: uterus and cervix and bilateral tubes  Fluids: 3000 cccrystalloid  UOP: 400cc  EBL: 629BM  Complications:none  Procedure: The patient was taken to the operating room, placed under general anesthesia and prepped and draped in the normal sterile fashion. A Foley catheter was placed in the bladder. A weighted speculum and vaginal retractors were placed in the vagina. Tenaculum was placed on the anterior lip of the cervix.  A hulka manipulator was placed in the uterus. The remaining vaginal instruments were removed.  Attention was then turned to the abdomen. A 10 mm infraumbilical incision was made with the scalpel after 5 cc of 25% percent Marcaine was used for local anesthesia. The subcutaneous tissue was dissected and the fascia was incised with the knife. A purse string stitch was placed in the fascia and Hassan placed into the intra-abdominal cavity and anchored to the suture. Intraabdominal placement was confirmed with the laparoscope.  Two 5 mm trochars were placed in the right and left lower quadrants under direct visualization with the laparoscope.    .   Both round ligaments were cauterized and cut with the gyrus bipolar cautery as well and the bladder flap created with the tripolar and removed away from the uterus. The left fallopian tube was cauterized and removed when the uterus was removed.  .  .  The left uterine ovarian ligament was then cauterized and cut.  The right fallopian tube was cauterized cut and removed.  The right utero-ovarian ligament was cauterized and cut with the tripolar cautery gyrus.    Attention was then turned to the vagina.  A weighted speculum was placed in the posterior fourchette.  petrussin mixture was placed circumferentially around the cervix.  With blunt and sharp dissection the cervix was dissected away from the bowel and bladder.  Both uterosacral ligaments were clamped, cut and suture ligated and held.  The anterior and posterior culdesac was entered sharply using metzenbaum scissors.  The cardinal ligaments and  The uterine arteries were clamped, cut and suture ligated bilaterally.    The uterus was too big to deliver.  It was morcellated sharply using the knife for thirty minutes.  The uterus was then reduced in size and delivered.  The mcall suture was placed.   The vaginal cuff was closed with interrupted suture of 0 vicryl.   the patient was given indigo carmine.  Cystoscopy was performed and both ureters were seen to efflux indigo carmine without difficulty. The bladder had full integrity with no suture or laceration visualized.  The vagina was inspected and the cuff was noted to be intact.  Attention was then turned back to the abdomen after removing top pair of gloves. The abdomen was reinsufflated with CO2 gas.   The abdomen and pelvis was copiously irrigated.  There was some bleeding from a pedicle on the left lower pelvis which was made hemostatic with cautery and gelfoam.     hemostasis was noted.     All trochars were removed under direct visualization using the laparoscope.  The umbilical fascia was reapproximated by tying the circumferential suture. The two 5 mm incisions were closed with 3-0 Monocryl via  a subcuticular stitch.  All remaining skin incisions were closed with Dermabond and the 10 mm skin incisions were reinforced using Dermabond.  Sponge lap and needle counts were correct.  The patient tolerated the procedure well and was returned to the PACU in stable condition

## 2014-09-19 NOTE — Transfer of Care (Signed)
Immediate Anesthesia Transfer of Care Note  Patient: Wendy Gates  Procedure(s) Performed: Procedure(s): LAPAROSCOPIC ASSISTED VAGINAL HYSTERECTOMY (N/A) LAPAROSCOPIC BILATERAL SALPINGECTOMY (Bilateral) CYSTOSCOPY (N/A)  Patient Location: PACU  Anesthesia Type:General  Level of Consciousness: awake, alert , oriented and patient cooperative  Airway & Oxygen Therapy: Patient Spontanous Breathing and Patient connected to nasal cannula oxygen  Post-op Assessment: Report given to RN, Post -op Vital signs reviewed and stable and Patient moving all extremities X 4  Post vital signs: Reviewed and stable  Last Vitals:  Filed Vitals:   09/19/14 1142  BP: 147/88  Pulse: 77  Temp: 37 C  Resp: 18    Complications: No apparent anesthesia complications

## 2014-09-20 ENCOUNTER — Encounter (HOSPITAL_COMMUNITY): Payer: Self-pay | Admitting: *Deleted

## 2014-09-20 DIAGNOSIS — D259 Leiomyoma of uterus, unspecified: Secondary | ICD-10-CM | POA: Diagnosis not present

## 2014-09-20 LAB — CBC
HEMATOCRIT: 25.3 % — AB (ref 36.0–46.0)
HEMOGLOBIN: 8.1 g/dL — AB (ref 12.0–15.0)
MCH: 22.7 pg — ABNORMAL LOW (ref 26.0–34.0)
MCHC: 32 g/dL (ref 30.0–36.0)
MCV: 70.9 fL — AB (ref 78.0–100.0)
Platelets: 217 10*3/uL (ref 150–400)
RBC: 3.57 MIL/uL — ABNORMAL LOW (ref 3.87–5.11)
RDW: 19.4 % — AB (ref 11.5–15.5)
WBC: 10.9 10*3/uL — ABNORMAL HIGH (ref 4.0–10.5)

## 2014-09-20 MED ORDER — OXYCODONE-ACETAMINOPHEN 5-325 MG PO TABS
1.0000 | ORAL_TABLET | ORAL | Status: DC | PRN
Start: 2014-09-20 — End: 2017-04-17

## 2014-09-20 MED ORDER — IBUPROFEN 600 MG PO TABS: ORAL_TABLET | ORAL | Status: AC

## 2014-09-20 MED ORDER — DOCUSATE SODIUM 100 MG PO CAPS: 100.0000 mg | ORAL_CAPSULE | Freq: Two times a day (BID) | ORAL | Status: DC

## 2014-09-20 MED ORDER — ONDANSETRON HCL 4 MG PO TABS: 4.0000 mg | ORAL_TABLET | Freq: Three times a day (TID) | ORAL | Status: DC | PRN

## 2014-09-20 NOTE — Addendum Note (Signed)
Addendum  created 09/20/14 0800 by Raenette Rover, CRNA   Modules edited: Notes Section   Notes Section:  File: [id number]

## 2014-09-20 NOTE — Progress Notes (Signed)
Pt discharged home with husband, Randall Hiss... Discharge instructions reviewed with pt and she verbalized understanding... Condition stable... No equipment... Taken to car via wheelchair by C. Ovid Curd, [place].

## 2014-09-20 NOTE — Progress Notes (Addendum)
Wendy Gates is a40 y.o.  [id number]  Post Op Date # 1:  LAVH/BS  Subjective: Patient is Doing well postoperatively. Patient has Pain is controlled with current analgesics. Medications being used: narcotic analgesics including PCA Dilaudid..  Ambulating in the halls with some dizziness however, offered blood transfusion-patient declines stating she'd rather just take iron.  Passing flatus but hasn't voided yet since Foley removed.  Tolerating liquids.   Objective: Vital signs in last 24 hours: Temp:  [98.2 F (36.8 C)-99.3 F (37.4 C)] 98.9 F (37.2 C) (04/12 0630) Pulse Rate:  [70-87] 87 (04/12 0630) Resp:  [11-18] 15 (04/12 0630) BP: (103-160)/(59-88) 129/66 mmHg (04/12 0630) SpO2:  [96 %-100 %] 99 % (04/12 0502)  Intake/Output from previous day: 04/11 0701 - 04/12 0700 In: 5259.2 [P.O.:450; I.V.:4809.2] Out: 2400 [Urine:1850] Intake/Output this shift:    Recent Labs Lab 09/16/14 1445 09/20/14 0507  WBC 5.0 10.9*  HGB 9.7* 8.1*  HCT 31.0* 25.3*  PLT 255 217     Recent Labs Lab 09/16/14 1445  NA 139  K 3.8  CL 106  CO2 27  BUN 9  CREATININE 0.78  CALCIUM 9.2  PROT 7.4  BILITOT 0.5  ALKPHOS 83  ALT 19  AST 29  GLUCOSE 90    EXAM: General: alert, cooperative and no distress Resp: clear to auscultation bilaterally Cardio: regular rate and rhythm, S1, S2 normal, no murmur, click, rub or gallop GI: Bowel sounds present, Umbilical dressing clean/dry/intact; lower abdominal incisions with good wound edge approximation-no evidence of infection.  Extremities: Homans sign is negative, no sign of DVT and  no calf tenderness. Vaginal Bleeding: none and perneal pad clean.   Assessment: s/p Procedure(s): LAPAROSCOPIC ASSISTED VAGINAL HYSTERECTOMY LAPAROSCOPIC BILATERAL SALPINGECTOMY CYSTOSCOPY: stable, progressing well and anemia  Plan: Advance diet Encourage ambulation Advance to PO medication Consider discharge home later today.     POWELL,ELMIRA,  PA-C 09/20/2014 7:17 AM Pt seen and examined.  She occasionally feels dizzy but declined blood.   DC home with written and verbal instruciton

## 2014-09-20 NOTE — Anesthesia Postprocedure Evaluation (Signed)
  Anesthesia Post-op Note  Patient: Wendy Gates  Procedure(s) Performed: Procedure(s): LAPAROSCOPIC ASSISTED VAGINAL HYSTERECTOMY (N/A) LAPAROSCOPIC BILATERAL SALPINGECTOMY (Bilateral) CYSTOSCOPY (N/A)  Patient Location: [facility]  Anesthesia Type:General  Level of Consciousness: awake, alert , oriented and patient cooperative  Airway and Oxygen Therapy: Patient Spontanous Breathing and Patient connected to nasal cannula oxygen  Post-op Pain: none  Post-op Assessment: Post-op Vital signs reviewed, Patient's Cardiovascular Status Stable, Respiratory Function Stable, Patent Airway and No signs of Nausea or vomiting  Post-op Vital Signs: Reviewed and stable  Last Vitals:  Filed Vitals:   09/20/14 0630  BP: 129/66  Pulse: 87  Temp: 37.2 C  Resp: 15    Complications: No apparent anesthesia complications

## 2014-09-20 NOTE — Discharge Instructions (Signed)
Call [facility] @ [phone] if:  You have a temperature greater than or equal to 100.4 degrees Farenheit orally You have pain that is not made better by the pain medication given and taken as directed You have excessive bleeding or problems urinating  Take Colace (Docusate Sodium/Stool Softener) 100 mg 2-3 times daily while taking narcotic pain medicine to avoid constipation or until bowel movements are regular. Take your iron tablets twice a day for the next 4 months Take, with food,  Ibuprofen 600 mg  every 6 hours for 5 days then as needed for pain  You may drive after 2 weeks You may walk up steps  You may shower  You may resume a regular diet  Keep incisions clean and dry (remove "belly button" dressing 7 days after surgery) Do not lift over 15 pounds for 6 weeks Avoid anything in vagina for 6 weeks (or until after your post-operative visit)

## 2014-09-20 NOTE — Discharge Summary (Signed)
Physician Discharge Summary  Patient ID: Wendy Gates MRN: [id number] DOB/AGE: 09-28-1965 49 y.o.  Admit date: 09/19/2014 Discharge date: 09/20/2014   Discharge Diagnoses:  Menorrhagia, Symtomatic Uterine Fibroids and Tamoxifen Therapy Active Problems:   Fibroids   Operation: Laparoscopically Assisted Vaginal Hysterectomy with Bilateral Salpingectomy   Discharged Condition: Good  Hospital Course: On the date of admission the patient underwent the aforementioned procedures and tolerated them well.  Post operative course was unremarkable with the patient demontrating minimal symptoms with a hemoglobin of 8.1  (she declined blood transfusiion) and by post operative day #2 has resumed bowel and bladder habits.  Having received the maximum benefit of her hospital stay,  the patient was discharged home on post operative day #1  Disposition: 01-Home or Self Care  Discharge Medications:    Medication List    STOP taking these medications        naproxen 500 MG tablet  Commonly known as:  NAPROSYN      TAKE these medications        aspirin EC 81 MG tablet  Take 81 mg by mouth daily.     cholecalciferol 1000 UNITS tablet  Commonly known as:  VITAMIN D  Take 1,000 Units by mouth daily.     docusate sodium 100 MG capsule  Commonly known as:  COLACE  Take 1 capsule (100 mg total) by mouth 2 (two) times daily.     ibuprofen 600 MG tablet  Commonly known as:  ADVIL,MOTRIN  1 po pc every 6 hours for 5 days then prn-pain     Iron Tabs  Take 1 tablet by mouth daily. Per pt, she takes iron tablets during the time of her menstrual cycle     multivitamin with minerals tablet  Take 1 tablet by mouth daily.     ondansetron 4 MG tablet  Commonly known as:  ZOFRAN  Take 1 tablet (4 mg total) by mouth every 8 (eight) hours as needed for nausea or vomiting.     oxyCODONE-acetaminophen 5-325 MG per tablet  Commonly known as:  PERCOCET/ROXICET  Take 1-2 tablets by mouth every 4  (four) hours as needed for severe pain (moderate to severe pain (when tolerating fluids)).     tamoxifen 20 MG tablet  Commonly known as:  NOLVADEX  Take 1 tablet (20 mg total) by mouth daily.          Follow-up: Dr. Gwynneth Munson A. Dillard,  Nov 01, 2014 at 1: 45 p.m.   SignedEarnstine Regal, PA-C 09/20/2014, 7:30 AM

## 2015-01-03 ENCOUNTER — Ambulatory Visit: Payer: BLUE CROSS/BLUE SHIELD | Admitting: Hematology and Oncology

## 2015-01-03 NOTE — Assessment & Plan Note (Signed)
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 Hot flashes 2. Mild myalgias Otherwise patient appears to be tolerating tamoxifen fairly well  Breast cancer surveillance: 1. Breast exam 01/03/2015 is normal 2. Mammograms 05/02/2014 are normal  Return to clinic in 6 months for follow-up. After that once a year

## 2015-01-04 ENCOUNTER — Other Ambulatory Visit: Payer: Self-pay

## 2015-01-05 ENCOUNTER — Telehealth: Payer: Self-pay | Admitting: Hematology and Oncology

## 2015-01-05 NOTE — Telephone Encounter (Signed)
Called and left a message with a new appointment per pof  anne °

## 2015-01-09 ENCOUNTER — Encounter: Payer: Self-pay | Admitting: Hematology and Oncology

## 2015-01-09 ENCOUNTER — Ambulatory Visit (HOSPITAL_BASED_OUTPATIENT_CLINIC_OR_DEPARTMENT_OTHER): Payer: BLUE CROSS/BLUE SHIELD | Admitting: Hematology and Oncology

## 2015-01-09 VITALS — BP 140/90 | HR 74 | Temp 98.7°F | Resp 18 | Wt 163.4 lb

## 2015-01-09 DIAGNOSIS — D0501 Lobular carcinoma in situ of right breast: Secondary | ICD-10-CM | POA: Diagnosis not present

## 2015-01-09 NOTE — Addendum Note (Signed)
Addended by: Prentiss Bells on: 01/09/2015 12:53 PM   Modules accepted: Medications

## 2015-01-09 NOTE — Assessment & Plan Note (Signed)
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 Hot flashes 2. Mild myalgias Otherwise patient appears to be tolerating tamoxifen fairly well  Breast cancer surveillance: 1. Breast exam 01/09/2015 is normal 2. Mammograms 05/02/2014 are normal  Return to clinic in 6 months for follow-up. After that, we can see her on annual basis.

## 2015-01-09 NOTE — Progress Notes (Signed)
Patient Care Team: Carol Ada, MD as PCP - General (Family Medicine)  DIAGNOSIS: No matching staging information was found for the patient.  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 She is taking it since February 2015. She reports no major problems other than chronic hot flashes. She denies any lumps or nodules in the breasts.  REVIEW OF SYSTEMS:   Constitutional: Denies fevers, chills or abnormal weight loss Eyes: Denies blurriness of vision Ears, nose, mouth, throat, and face: Denies mucositis or sore throat Respiratory: Denies cough, dyspnea or wheezes Cardiovascular: Denies palpitation, chest discomfort or lower extremity swelling Gastrointestinal:  Denies nausea, heartburn or change in bowel habits Skin: Denies abnormal skin rashes Lymphatics: Denies new lymphadenopathy or easy bruising Neurological:Denies numbness, tingling or new weaknesses Behavioral/Psych: Mood is stable, no new changes  Breast:  denies any pain or lumps or nodules in either breasts All other systems were reviewed with the patient and are negative.  I have reviewed the past medical history, past surgical history, social history and family history with the patient and they are unchanged from previous note.  ALLERGIES:  has No Known Allergies.  MEDICATIONS:  Current Outpatient Prescriptions  Medication Sig Dispense Refill  . aspirin EC 81 MG tablet Take 81 mg by mouth daily.    . cholecalciferol (VITAMIN D) 1000 UNITS tablet Take 1,000 Units by mouth  daily.    Marland Kitchen docusate sodium (COLACE) 100 MG capsule Take 1 capsule (100 mg total) by mouth 2 (two) times daily. 10 capsule 0  . ibuprofen (ADVIL,MOTRIN) 600 MG tablet 1 po pc every 6 hours for 5 days then prn-pain 30 tablet 1  . Iron TABS Take 1 tablet by mouth daily. Per pt, she takes iron tablets during the time of her menstrual cycle    . Multiple Vitamins-Minerals (MULTIVITAMIN WITH MINERALS) tablet Take 1 tablet by mouth daily.    . ondansetron (ZOFRAN) 4 MG tablet Take 1 tablet (4 mg total) by mouth every 8 (eight) hours as needed for nausea or vomiting. 20 tablet 0  . oxyCODONE-acetaminophen (PERCOCET/ROXICET) 5-325 MG per tablet Take 1-2 tablets by mouth every 4 (four) hours as needed for severe pain (moderate to severe pain (when tolerating fluids)). 30 tablet 0  . tamoxifen (NOLVADEX) 20 MG tablet Take 1 tablet (20 mg total) by mouth daily. 90 tablet 3   No current facility-administered medications for this visit.    PHYSICAL EXAMINATION: ECOG PERFORMANCE STATUS: 0 - Asymptomatic  There were no vitals filed for this visit. There were no vitals filed for this visit.  GENERAL:alert, no distress and comfortable SKIN: skin color, texture, turgor are normal, no rashes or significant lesions EYES: normal, Conjunctiva are pink and non-injected, sclera clear OROPHARYNX:no exudate, no erythema and lips, buccal mucosa, and tongue normal  NECK: supple, thyroid normal size, non-tender, without nodularity LYMPH:  no palpable lymphadenopathy in the cervical, axillary or inguinal LUNGS: clear to auscultation and percussion with normal breathing effort HEART: regular rate & rhythm and no murmurs and no lower extremity edema ABDOMEN:abdomen soft, non-tender and normal bowel sounds Musculoskeletal:no cyanosis  of digits and no clubbing  NEURO: alert & oriented x 3 with fluent speech, no focal motor/sensory deficits BREAST: No palpable masses or nodules in either right or left breasts. No palpable  axillary supraclavicular or infraclavicular adenopathy no breast tenderness or nipple discharge. (exam performed in the presence of a chaperone)  LABORATORY DATA:  I have reviewed the data as listed   Chemistry      Component Value Date/Time   NA 139 09/16/2014 1445   NA 140 06/28/2014 1410   K 3.8 09/16/2014 1445   K 3.6 06/28/2014 1410   CL 106 09/16/2014 1445   CO2 27 09/16/2014 1445   CO2 24 06/28/2014 1410   BUN 9 09/16/2014 1445   BUN 6.8* 06/28/2014 1410   CREATININE 0.78 09/16/2014 1445   CREATININE 0.8 06/28/2014 1410      Component Value Date/Time   CALCIUM 9.2 09/16/2014 1445   CALCIUM 8.7 06/28/2014 1410   ALKPHOS 83 09/16/2014 1445   ALKPHOS 77 06/28/2014 1410   AST 29 09/16/2014 1445   AST 22 06/28/2014 1410   ALT 19 09/16/2014 1445   ALT 14 06/28/2014 1410   BILITOT 0.5 09/16/2014 1445   BILITOT 0.29 06/28/2014 1410       Lab Results  Component Value Date   WBC 10.9* 09/20/2014   HGB 8.1* 09/20/2014   HCT 25.3* 09/20/2014   MCV 70.9* 09/20/2014   PLT 217 09/20/2014   NEUTROABS 1.5 06/28/2014   ASSESSMENT & PLAN:  Neoplasm of right breast, 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 Hot flashes 2. Mild myalgias Otherwise patient appears to be tolerating tamoxifen fairly well  Breast cancer surveillance: 1. Breast exam 01/09/2015 is normal 2. Mammograms 05/02/2014 are normal  Return to clinic in 6 months for follow-up. After that, we can see her on annual basis.    No orders of the defined types were placed in this encounter.   The patient has a good understanding of the overall plan. she agrees with it. she will call with any problems that may develop before the next visit here.   Rulon Eisenmenger, MD

## 2015-01-23 ENCOUNTER — Other Ambulatory Visit: Payer: Self-pay | Admitting: Hematology

## 2015-01-24 ENCOUNTER — Telehealth: Payer: Self-pay | Admitting: *Deleted

## 2015-01-24 DIAGNOSIS — D0501 Lobular carcinoma in situ of right breast: Secondary | ICD-10-CM

## 2015-01-24 MED ORDER — TAMOXIFEN CITRATE 20 MG PO TABS: 20.0000 mg | ORAL_TABLET | Freq: Every day | ORAL | Status: DC

## 2015-01-24 NOTE — Telephone Encounter (Signed)
TC from [facility] this am requesting refill on her tamoxifen. She saw Dr. Lindi Adie on 01/09/15 and is to return in 6 months , then will be seen yearly per his note. 6 refills escribed for this patient.

## 2015-05-01 ENCOUNTER — Other Ambulatory Visit: Payer: Self-pay

## 2015-05-01 DIAGNOSIS — Z1231 Encounter for screening mammogram for malignant neoplasm of breast: Secondary | ICD-10-CM

## 2015-05-18 ENCOUNTER — Ambulatory Visit
Admission: RE | Admit: 2015-05-18 | Discharge: 2015-05-18 | Disposition: A | Discharge status: Home / Self Care | Payer: BLUE CROSS/BLUE SHIELD | Source: Ambulatory Visit

## 2015-05-18 DIAGNOSIS — Z1231 Encounter for screening mammogram for malignant neoplasm of breast: Secondary | ICD-10-CM

## 2015-05-18 IMAGING — MG MM SCREEN MAMMOGRAM BILATERAL
4 series · 4 of 4 positions shown · non-contrast
Comparison: Previous exam(s).

CLINICAL DATA: Screening. The patient had a right lumpectomy for
lobular carcinoma in situ on [DATE].

EXAM:
DIGITAL SCREENING BILATERAL MAMMOGRAM WITH CAD

[R CC]
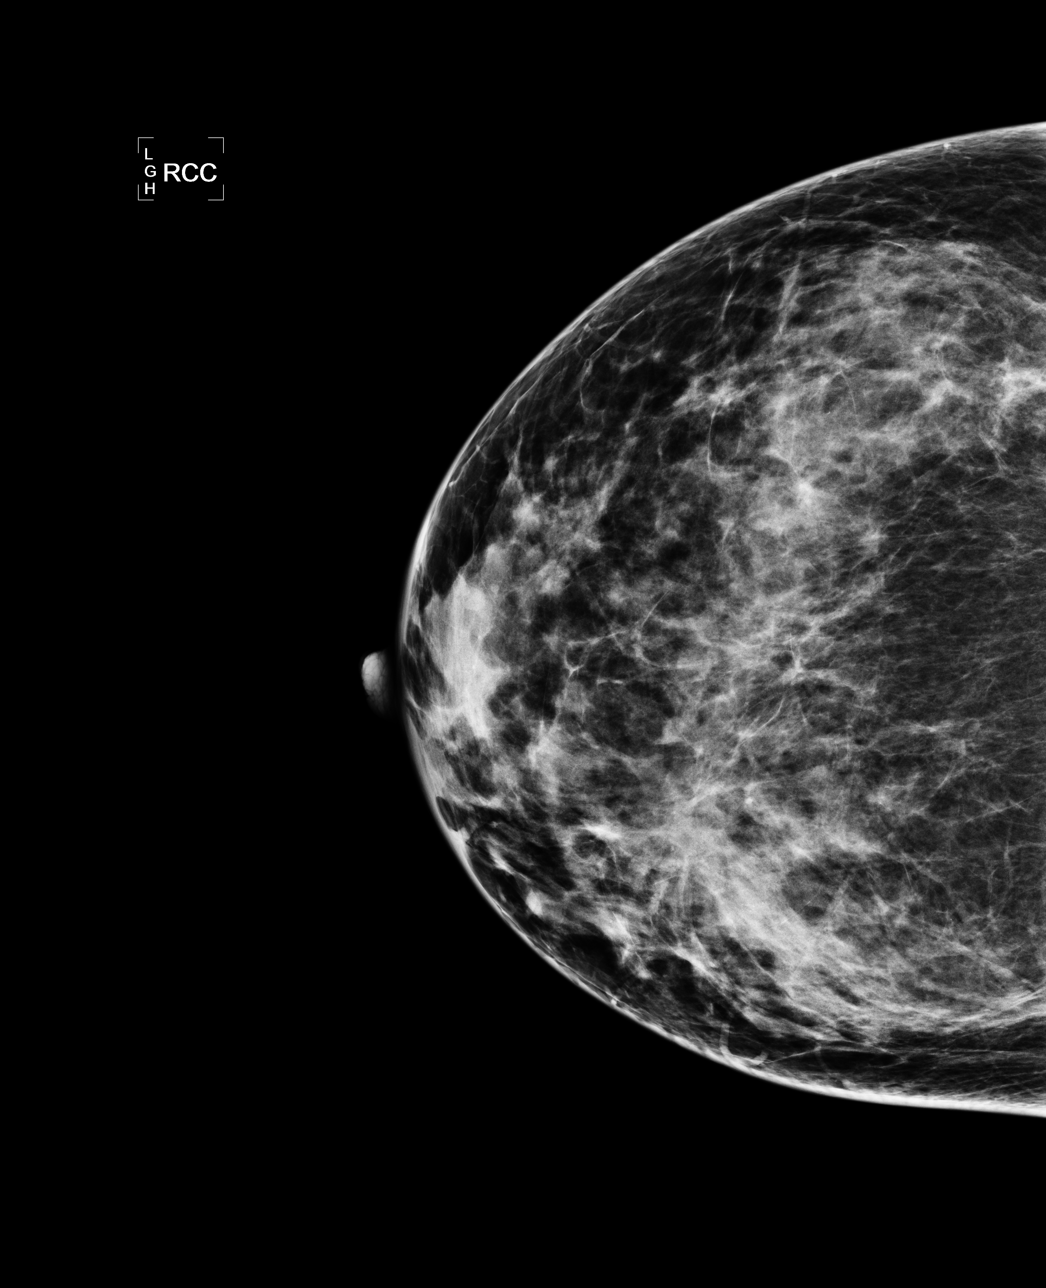

[L CC]
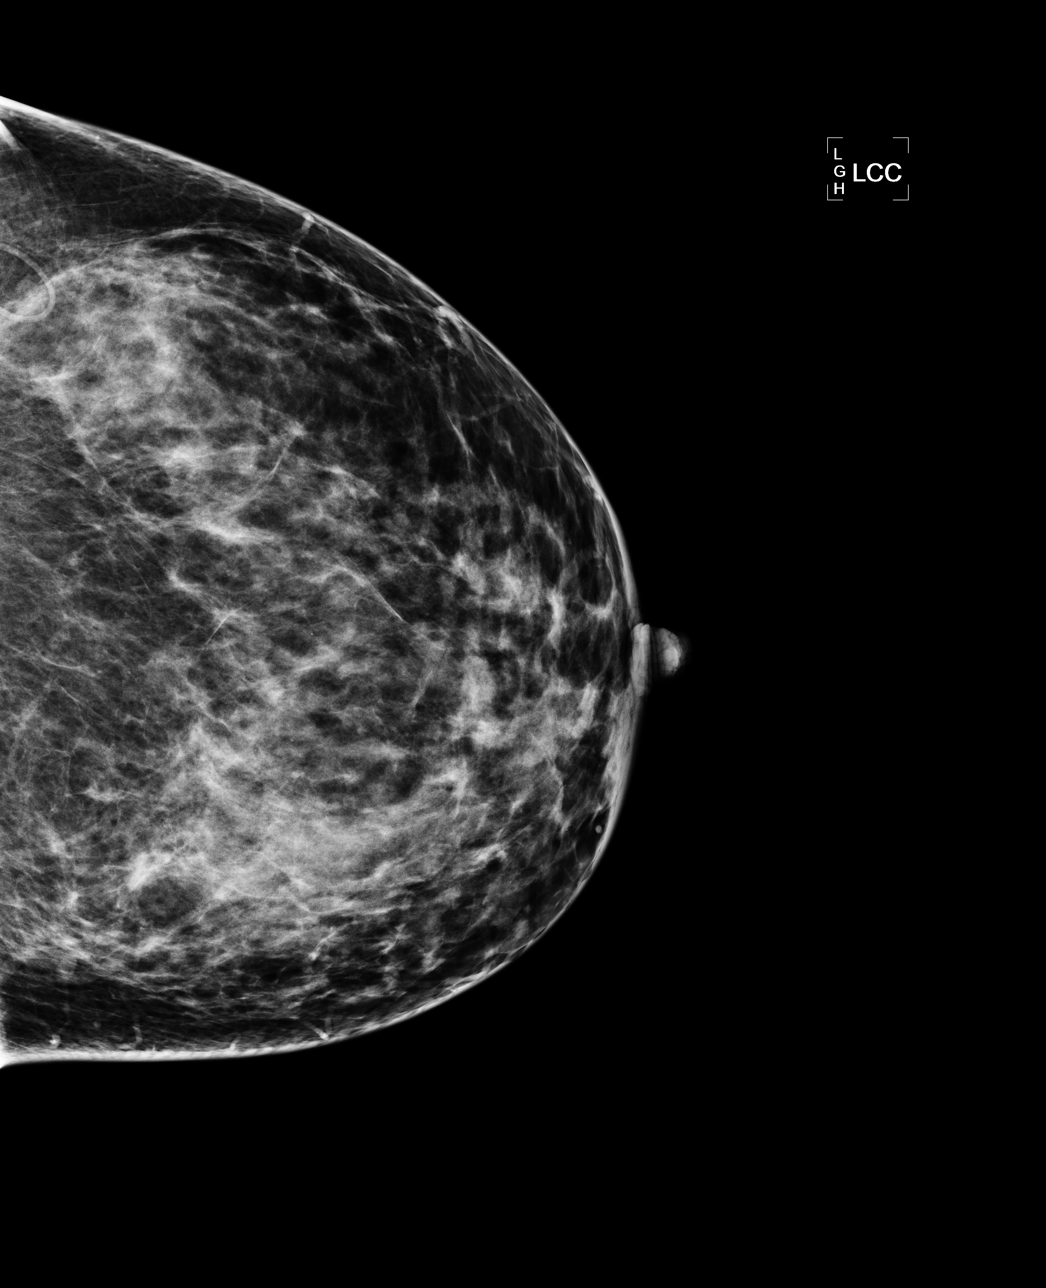

[L MLO]
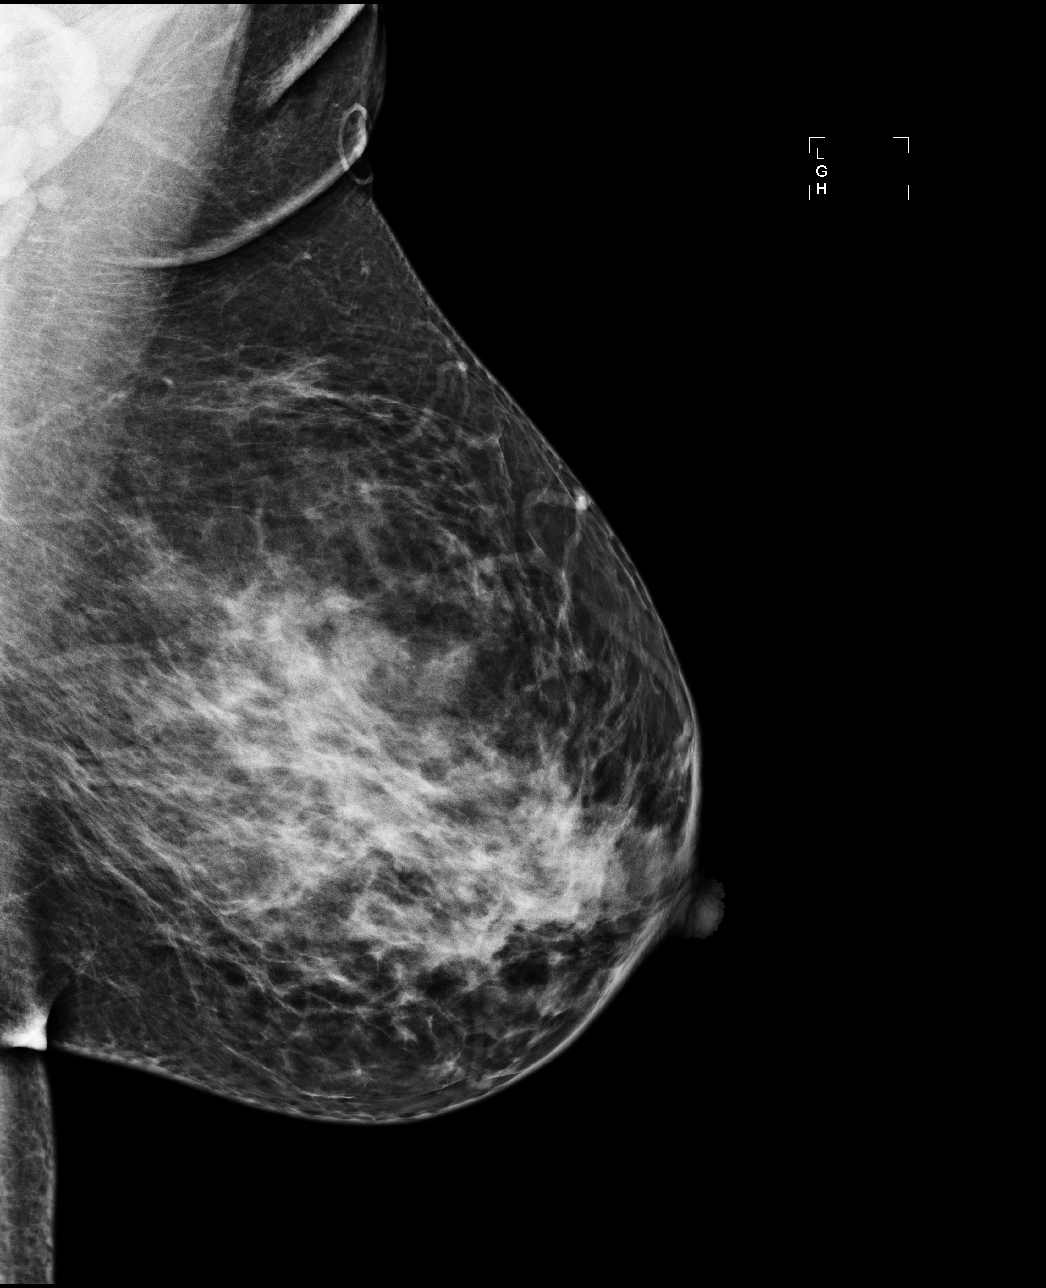

[R MLO]
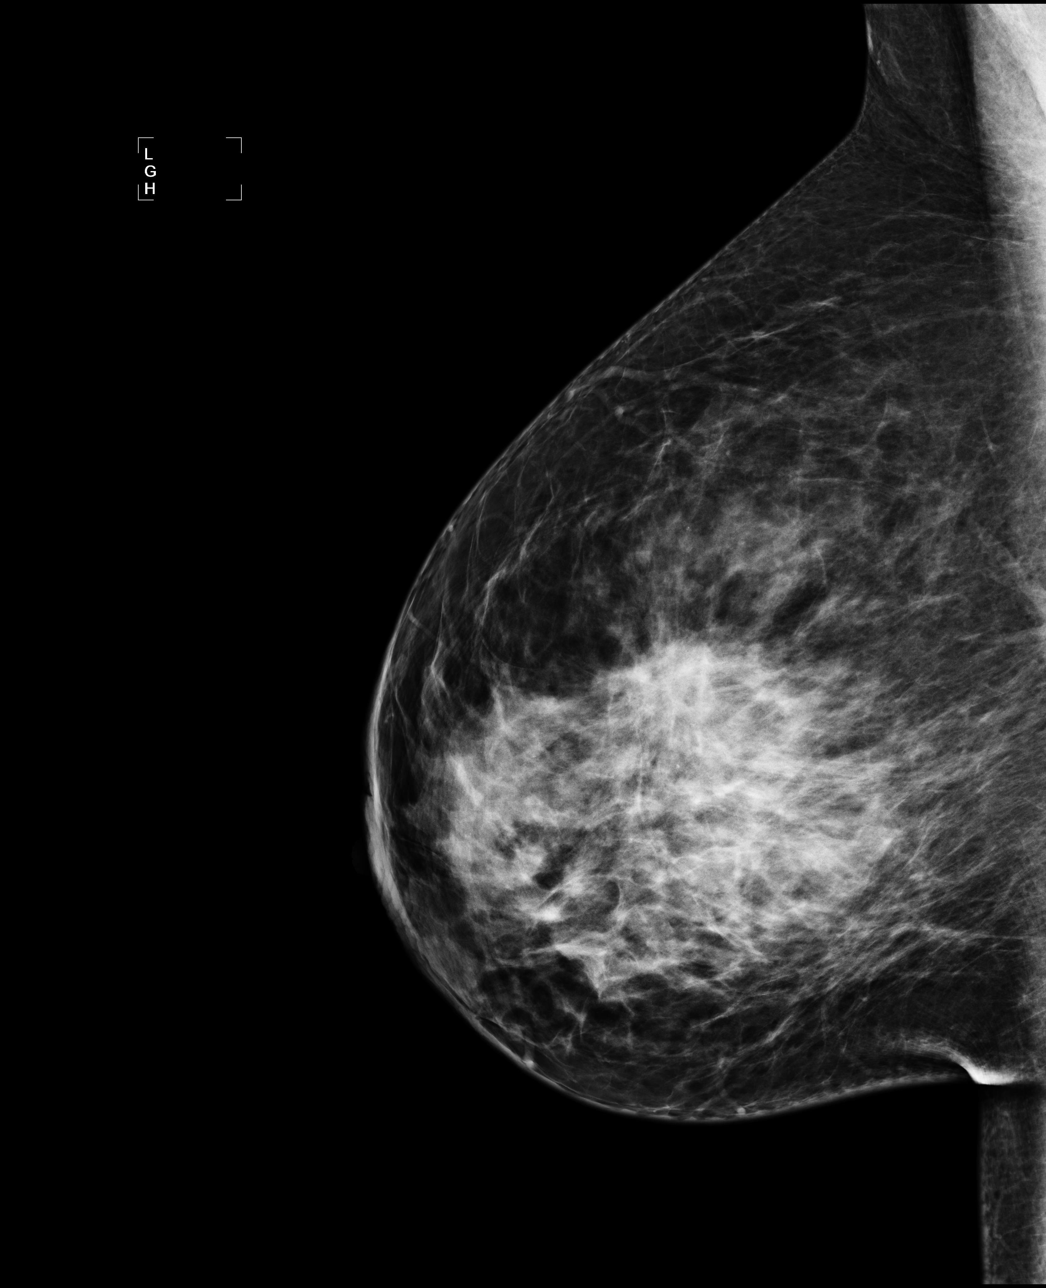

[4 of 4 positions shown; findings below may reference images not displayed]

ACR Breast Density Category c: The breast tissue is heterogeneously
dense, which may obscure small masses.
FINDINGS: There is progressive architectural distortion in the superior right
breast in the middle third, at the site of surgical excision. The
architectural distortion is less prominent in the corresponding
medial right breast compared to [DATE]. No findings elsewhere in
either breast suspicious for malignancy. Images were processed with
CAD.
IMPRESSION: Little overall change in architectural distortion compatible with
postoperative scarring in the upper inner right breast. No
mammographic evidence of malignancy. A result letter of this
screening mammogram will be mailed directly to the patient.

RECOMMENDATION:
Screening mammogram in one year. (Code:[T5])

BI-RADS CATEGORY  2: Benign.

## 2015-09-02 ENCOUNTER — Emergency Department (HOSPITAL_BASED_OUTPATIENT_CLINIC_OR_DEPARTMENT_OTHER)
Admission: EM | Admit: 2015-09-02 | Discharge: 2015-09-02 | Disposition: A | Discharge status: Home / Self Care | Payer: BLUE CROSS/BLUE SHIELD

## 2015-09-02 ENCOUNTER — Encounter (HOSPITAL_BASED_OUTPATIENT_CLINIC_OR_DEPARTMENT_OTHER): Payer: Self-pay | Admitting: Emergency Medicine

## 2015-09-02 NOTE — ED Notes (Addendum)
WRONG PATIENT> IGNORE ALL RN NOTES ON THIS CHART FOR 09/02/15 prior to this note.

## 2015-10-07 LAB — GLUCOSE, POCT (MANUAL RESULT ENTRY): POC Glucose: 52 mg/dl — AB (ref 70–99)

## 2015-10-07 LAB — POCT GLYCOSYLATED HEMOGLOBIN (HGB A1C): Hemoglobin A1C: 5.3

## 2016-02-21 ENCOUNTER — Other Ambulatory Visit: Payer: Self-pay | Admitting: *Deleted

## 2016-02-21 DIAGNOSIS — D0501 Lobular carcinoma in situ of right breast: Secondary | ICD-10-CM

## 2016-02-21 MED ORDER — TAMOXIFEN CITRATE 20 MG PO TABS: 20.0000 mg | ORAL_TABLET | Freq: Every day | ORAL | 6 refills | Status: DC

## 2016-05-08 ENCOUNTER — Other Ambulatory Visit: Payer: Self-pay | Admitting: Obstetrics and Gynecology

## 2016-05-08 DIAGNOSIS — Z1231 Encounter for screening mammogram for malignant neoplasm of breast: Secondary | ICD-10-CM

## 2016-05-29 ENCOUNTER — Ambulatory Visit
Admission: RE | Admit: 2016-05-29 | Discharge: 2016-05-29 | Disposition: A | Discharge status: Home / Self Care | Payer: BLUE CROSS/BLUE SHIELD | Source: Ambulatory Visit | Attending: Obstetrics and Gynecology | Admitting: Obstetrics and Gynecology

## 2016-05-29 DIAGNOSIS — Z1231 Encounter for screening mammogram for malignant neoplasm of breast: Secondary | ICD-10-CM

## 2017-01-02 DIAGNOSIS — H40013 Open angle with borderline findings, low risk, bilateral: Secondary | ICD-10-CM | POA: Diagnosis not present

## 2017-01-28 DIAGNOSIS — Z01419 Encounter for gynecological examination (general) (routine) without abnormal findings: Secondary | ICD-10-CM | POA: Diagnosis not present

## 2017-01-28 DIAGNOSIS — Z1272 Encounter for screening for malignant neoplasm of vagina: Secondary | ICD-10-CM | POA: Diagnosis not present

## 2017-02-25 DIAGNOSIS — Z1211 Encounter for screening for malignant neoplasm of colon: Secondary | ICD-10-CM | POA: Diagnosis not present

## 2017-02-25 DIAGNOSIS — K5904 Chronic idiopathic constipation: Secondary | ICD-10-CM | POA: Diagnosis not present

## 2017-03-14 DIAGNOSIS — Z1211 Encounter for screening for malignant neoplasm of colon: Secondary | ICD-10-CM | POA: Diagnosis not present

## 2017-04-08 ENCOUNTER — Other Ambulatory Visit: Payer: Self-pay

## 2017-04-08 DIAGNOSIS — D0501 Lobular carcinoma in situ of right breast: Secondary | ICD-10-CM

## 2017-04-08 MED ORDER — TAMOXIFEN CITRATE 20 MG PO TABS: 20.0000 mg | ORAL_TABLET | Freq: Every day | ORAL | 0 refills | Status: DC

## 2017-04-08 NOTE — Telephone Encounter (Signed)
Pt requesting refill on her tamoxifen. Pt had not been seen in 2 yrs. Pt was unaware that she was supposed to have a yearly check up with her oncologist. Told pt that we can refill it for 30 days but will need to make an appointment to be seen in the next week or 2 for follow up. Pt agreeable and confirmed time/date next week for appt. Sent refill to [facility]. No further needs at this time.

## 2017-04-11 ENCOUNTER — Other Ambulatory Visit: Payer: Self-pay

## 2017-04-11 DIAGNOSIS — D0501 Lobular carcinoma in situ of right breast: Secondary | ICD-10-CM

## 2017-04-11 MED ORDER — TAMOXIFEN CITRATE 20 MG PO TABS: 20.0000 mg | ORAL_TABLET | Freq: Every day | ORAL | 3 refills | Status: DC

## 2017-04-11 NOTE — Telephone Encounter (Signed)
Called pt to let her know that her refill for tamoxifen was sent out to [facility].

## 2017-04-17 ENCOUNTER — Ambulatory Visit: Payer: BLUE CROSS/BLUE SHIELD | Admitting: Hematology and Oncology

## 2017-04-17 DIAGNOSIS — D0501 Lobular carcinoma in situ of right breast: Secondary | ICD-10-CM | POA: Diagnosis not present

## 2017-04-17 MED ORDER — TAMOXIFEN CITRATE 20 MG PO TABS: 20.0000 mg | ORAL_TABLET | Freq: Every day | ORAL | 3 refills | Status: DC

## 2017-04-17 MED ORDER — TURMERIC 500 MG PO CAPS: 1.0000 | ORAL_CAPSULE | Freq: Every day | ORAL | Status: AC

## 2017-04-17 NOTE — Progress Notes (Signed)
Patient Care Team: Carol Ada, MD as PCP - General (Family Medicine)  DIAGNOSIS:  Encounter Diagnosis  Name Primary?  . 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  She is tolerating extremely well.  She denies any major hot flashes or myalgias.  REVIEW OF SYSTEMS:   Constitutional: Denies fevers, chills or abnormal weight loss Eyes: Denies blurriness of vision Ears, nose, mouth, throat, and face: Denies mucositis or sore throat Respiratory: Denies cough, dyspnea or wheezes Cardiovascular: Denies palpitation, chest discomfort Gastrointestinal:  Denies nausea, heartburn or change in bowel habits Skin: Denies abnormal skin rashes Lymphatics: Denies new lymphadenopathy or easy bruising Neurological:Denies numbness, tingling or new weaknesses Behavioral/Psych: Mood is stable, no new changes  Extremities: No lower extremity edema Breast:  denies any pain or lumps or nodules in either breasts All other systems were reviewed with the patient and are negative.  I have reviewed the past medical history, past surgical history, social history and family history with the patient and they are unchanged from previous note.  ALLERGIES:  has No Known Allergies.  MEDICATIONS:  Current Outpatient Medications  Medication Sig Dispense Refill  . aspirin EC 81 MG tablet Take 81  mg by mouth daily.    . cholecalciferol (VITAMIN D) 1000 UNITS tablet Take 1,000 Units by mouth daily.    Marland Kitchen docusate sodium (COLACE) 100 MG capsule Take 1 capsule (100 mg total) by mouth 2 (two) times daily. 10 capsule 0  . ibuprofen (ADVIL,MOTRIN) 600 MG tablet 1 po pc every 6 hours for 5 days then prn-pain 30 tablet 1  . Iron TABS Take 1 tablet by mouth daily. Per pt, she takes iron tablets during the time of her menstrual cycle    . Multiple Vitamins-Minerals (MULTIVITAMIN WITH MINERALS) tablet Take 1 tablet by mouth daily.    . ondansetron (ZOFRAN) 4 MG tablet Take 1 tablet (4 mg total) by mouth every 8 (eight) hours as needed for nausea or vomiting. 20 tablet 0  . oxyCODONE-acetaminophen (PERCOCET/ROXICET) 5-325 MG per tablet Take 1-2 tablets by mouth every 4 (four) hours as needed for severe pain (moderate to severe pain (when tolerating fluids)). 30 tablet 0  . tamoxifen (NOLVADEX) 20 MG tablet Take 1 tablet (20 mg total) by mouth daily. 90 tablet 3   No current facility-administered medications for this visit.     PHYSICAL EXAMINATION: ECOG PERFORMANCE STATUS: 1 - Symptomatic but completely ambulatory  Vitals:   04/17/17 1158  BP: 131/82  Pulse: 71  Resp: 18  Temp: 98 F (36.7 C)  SpO2: 100%   Filed Weights   04/17/17 1158  Weight: 151 lb 6.4 oz (68.7 kg)    GENERAL:alert, no distress and comfortable SKIN: skin color, texture, turgor are normal, no rashes or significant lesions EYES: normal, Conjunctiva are pink and non-injected, sclera clear OROPHARYNX:no exudate, no  erythema and lips, buccal mucosa, and tongue normal  NECK: supple, thyroid normal size, non-tender, without nodularity LYMPH:  no palpable lymphadenopathy in the cervical, axillary or inguinal LUNGS: clear to auscultation and percussion with normal breathing effort HEART: regular rate & rhythm and no murmurs and no lower extremity edema ABDOMEN:abdomen soft, non-tender and normal bowel  sounds MUSCULOSKELETAL:no cyanosis of digits and no clubbing  NEURO: alert & oriented x 3 with fluent speech, no focal motor/sensory deficits EXTREMITIES: No lower extremity edema BREAST: No palpable masses or nodules in either right or left breasts. No palpable axillary supraclavicular or infraclavicular adenopathy no breast tenderness or nipple discharge. (exam performed in the presence of a chaperone)  LABORATORY DATA:  I have reviewed the data as listed   Chemistry      Component Value Date/Time   NA 139 09/16/2014 1445   NA 140 06/28/2014 1410   K 3.8 09/16/2014 1445   K 3.6 06/28/2014 1410   CL 106 09/16/2014 1445   CO2 27 09/16/2014 1445   CO2 24 06/28/2014 1410   BUN 9 09/16/2014 1445   BUN 6.8 (L) 06/28/2014 1410   CREATININE 0.78 09/16/2014 1445   CREATININE 0.8 06/28/2014 1410      Component Value Date/Time   CALCIUM 9.2 09/16/2014 1445   CALCIUM 8.7 06/28/2014 1410   ALKPHOS 83 09/16/2014 1445   ALKPHOS 77 06/28/2014 1410   AST 29 09/16/2014 1445   AST 22 06/28/2014 1410   ALT 19 09/16/2014 1445   ALT 14 06/28/2014 1410   BILITOT 0.5 09/16/2014 1445   BILITOT 0.29 06/28/2014 1410       Lab Results  Component Value Date   WBC 10.9 (H) 09/20/2014   HGB 8.1 (L) 09/20/2014   HCT 25.3 (L) 09/20/2014   MCV 70.9 (L) 09/20/2014   PLT 217 09/20/2014   NEUTROABS 1.5 06/28/2014    ASSESSMENT & PLAN:  Neoplasm of right breast, 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 Hot flashes 2. Mild myalgias Otherwise patient appears to be tolerating tamoxifen fairly well  Breast cancer surveillance: 1. Breast exam 04/17/2017 is normal 2. Mammograms 05/29/2016 are normal, breast density category C  Return to clinic in 1 year for follow-up   I spent 25 minutes talking to the patient of which more than half was spent in counseling  and coordination of care.  No orders of the defined types were placed in this encounter.  The patient has a good understanding of the overall plan. she agrees with it. she will call with any problems that may develop before the next visit here.   Rulon Eisenmenger, MD 04/17/17

## 2017-04-17 NOTE — Assessment & Plan Note (Signed)
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 Hot flashes 2. Mild myalgias Otherwise patient appears to be tolerating tamoxifen fairly well  Breast cancer surveillance: 1. Breast exam 04/17/2017 is normal 2. Mammograms 05/29/2016 are normal, breast density category C  Return to clinic in 1 year for follow-up

## 2017-05-13 ENCOUNTER — Other Ambulatory Visit: Payer: Self-pay | Admitting: Hematology and Oncology

## 2017-05-13 DIAGNOSIS — Z1231 Encounter for screening mammogram for malignant neoplasm of breast: Secondary | ICD-10-CM

## 2017-06-11 ENCOUNTER — Ambulatory Visit
Admission: RE | Admit: 2017-06-11 | Discharge: 2017-06-11 | Disposition: A | Discharge status: Home / Self Care | Payer: BLUE CROSS/BLUE SHIELD | Source: Ambulatory Visit | Attending: Hematology and Oncology | Admitting: Hematology and Oncology

## 2017-06-11 DIAGNOSIS — Z1231 Encounter for screening mammogram for malignant neoplasm of breast: Secondary | ICD-10-CM

## 2017-06-12 ENCOUNTER — Other Ambulatory Visit: Payer: Self-pay | Admitting: Hematology and Oncology

## 2017-06-12 DIAGNOSIS — R928 Other abnormal and inconclusive findings on diagnostic imaging of breast: Secondary | ICD-10-CM

## 2017-06-18 ENCOUNTER — Ambulatory Visit
Admission: RE | Admit: 2017-06-18 | Discharge: 2017-06-18 | Disposition: A | Discharge status: Home / Self Care | Payer: BLUE CROSS/BLUE SHIELD | Source: Ambulatory Visit | Attending: Hematology and Oncology | Admitting: Hematology and Oncology

## 2017-06-18 DIAGNOSIS — R921 Mammographic calcification found on diagnostic imaging of breast: Secondary | ICD-10-CM | POA: Diagnosis not present

## 2017-06-18 DIAGNOSIS — R928 Other abnormal and inconclusive findings on diagnostic imaging of breast: Secondary | ICD-10-CM

## 2018-02-04 DIAGNOSIS — Z01419 Encounter for gynecological examination (general) (routine) without abnormal findings: Secondary | ICD-10-CM | POA: Diagnosis not present

## 2018-02-04 DIAGNOSIS — Z6830 Body mass index (BMI) 30.0-30.9, adult: Secondary | ICD-10-CM | POA: Diagnosis not present

## 2018-04-13 DIAGNOSIS — I10 Essential (primary) hypertension: Secondary | ICD-10-CM | POA: Diagnosis not present

## 2018-04-13 DIAGNOSIS — R42 Dizziness and giddiness: Secondary | ICD-10-CM | POA: Diagnosis not present

## 2018-04-20 DIAGNOSIS — I10 Essential (primary) hypertension: Secondary | ICD-10-CM | POA: Diagnosis not present

## 2018-04-20 DIAGNOSIS — E785 Hyperlipidemia, unspecified: Secondary | ICD-10-CM | POA: Diagnosis not present

## 2018-04-21 ENCOUNTER — Inpatient Hospital Stay: Payer: BLUE CROSS/BLUE SHIELD | Attending: Hematology and Oncology | Admitting: Hematology and Oncology

## 2018-04-21 DIAGNOSIS — D0501 Lobular carcinoma in situ of right breast: Secondary | ICD-10-CM | POA: Insufficient documentation

## 2018-04-21 DIAGNOSIS — N951 Menopausal and female climacteric states: Secondary | ICD-10-CM | POA: Insufficient documentation

## 2018-04-21 DIAGNOSIS — Z79899 Other long term (current) drug therapy: Secondary | ICD-10-CM | POA: Insufficient documentation

## 2018-04-28 ENCOUNTER — Inpatient Hospital Stay: Payer: BLUE CROSS/BLUE SHIELD | Admitting: Hematology and Oncology

## 2018-04-28 ENCOUNTER — Telehealth: Payer: Self-pay | Admitting: Adult Health

## 2018-04-28 ENCOUNTER — Telehealth: Payer: Self-pay | Admitting: Hematology and Oncology

## 2018-04-28 DIAGNOSIS — D0501 Lobular carcinoma in situ of right breast: Secondary | ICD-10-CM

## 2018-04-28 DIAGNOSIS — N951 Menopausal and female climacteric states: Secondary | ICD-10-CM

## 2018-04-28 DIAGNOSIS — Z79899 Other long term (current) drug therapy: Secondary | ICD-10-CM | POA: Diagnosis not present

## 2018-04-28 NOTE — Progress Notes (Signed)
Patient Care Team: Carol Ada, MD as PCP - General (Family Medicine)  DIAGNOSIS:  Encounter Diagnosis  Name Primary?  . 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  She completed 5 years of therapy by February 2020.  She is tolerating tamoxifen extremely well.  She does complain of hot flashes.  Denies any myalgias.  REVIEW OF SYSTEMS:   Constitutional: Denies fevers, chills or abnormal weight loss Eyes: Denies blurriness of vision Ears, nose, mouth, throat, and face: Denies mucositis or sore throat Respiratory: Denies cough, dyspnea or wheezes Cardiovascular: Denies palpitation, chest discomfort Gastrointestinal:  Denies nausea, heartburn or change in bowel habits Skin: Denies abnormal skin rashes Lymphatics: Denies new lymphadenopathy or easy bruising Neurological:Denies numbness, tingling or new weaknesses Behavioral/Psych: Mood is stable, no new changes  Extremities: No lower extremity edema Breast:  denies any pain or lumps or nodules in either breasts All other systems were reviewed with the patient and are negative.  I have reviewed the past medical history, past surgical history, social history and family history with the patient and they are unchanged from previous note.  ALLERGIES:  has No Known Allergies.  MEDICATIONS:  Current Outpatient Medications  Medication Sig Dispense Refill  . aspirin EC 81 MG tablet Take 81 mg by  mouth daily.    . cholecalciferol (VITAMIN D) 1000 UNITS tablet Take 1,000 Units by mouth daily.    Marland Kitchen ibuprofen (ADVIL,MOTRIN) 600 MG tablet 1 po pc every 6 hours for 5 days then prn-pain 30 tablet 1  . Multiple Vitamins-Minerals (MULTIVITAMIN WITH MINERALS) tablet Take 1 tablet by mouth daily.    . tamoxifen (NOLVADEX) 20 MG tablet Take 1 tablet (20 mg total) daily by mouth. 90 tablet 3  . Turmeric 500 MG CAPS Take 1 tablet daily by mouth.     No current facility-administered medications for this visit.     PHYSICAL EXAMINATION: ECOG PERFORMANCE STATUS: 0 - Asymptomatic  Vitals:   04/28/18 1456  BP: 133/81  Pulse: 85  Resp: 18  Temp: 98.3 F (36.8 C)  SpO2: 100%   Filed Weights   04/28/18 1456  Weight: 162 lb 11.2 oz (73.8 kg)    GENERAL:alert, no distress and comfortable SKIN: skin color, texture, turgor are normal, no rashes or significant lesions EYES: normal, Conjunctiva are pink and non-injected, sclera clear OROPHARYNX:no exudate, no erythema and lips, buccal mucosa, and tongue normal  NECK: supple, thyroid normal size, non-tender, without nodularity LYMPH:  no palpable lymphadenopathy in the cervical, axillary or inguinal LUNGS: clear to auscultation and percussion with normal breathing effort HEART: regular rate & rhythm and no murmurs and no lower extremity edema ABDOMEN:abdomen soft, non-tender and normal bowel sounds MUSCULOSKELETAL:no cyanosis of digits and no clubbing  NEURO: alert & oriented x 3 with fluent speech, no focal motor/sensory deficits EXTREMITIES: No lower extremity edema BREAST: No palpable masses or nodules in either right or left breasts. No palpable axillary supraclavicular  or infraclavicular adenopathy no breast tenderness or nipple discharge. (exam performed in the presence of a chaperone)  LABORATORY DATA:  I have reviewed the data as listed CMP Latest Ref Rng & Units 09/16/2014 06/28/2014 12/24/2013  Glucose 70 - 99 mg/dL 90 99 85  BUN 6 -  23 mg/dL 9 6.8(L) 11.6  Creatinine 0.50 - 1.10 mg/dL 0.78 0.8 0.8  Sodium 135 - 145 mmol/L 139 140 140  Potassium 3.5 - 5.1 mmol/L 3.8 3.6 4.6  Chloride 96 - 112 mmol/L 106 - -  CO2 19 - 32 mmol/L 27 24 25   Calcium 8.4 - 10.5 mg/dL 9.2 8.7 9.6  Total Protein 6.0 - 8.3 g/dL 7.4 7.2 7.5  Total Bilirubin 0.3 - 1.2 mg/dL 0.5 0.29 0.56  Alkaline Phos 39 - 117 U/L 83 77 83  AST 0 - 37 U/L 29 22 28   ALT 0 - 35 U/L 19 14 19     Lab Results  Component Value Date   WBC 10.9 (H) 09/20/2014   HGB 8.1 (L) 09/20/2014   HCT 25.3 (L) 09/20/2014   MCV 70.9 (L) 09/20/2014   PLT 217 09/20/2014   NEUTROABS 1.5 06/28/2014    ASSESSMENT & PLAN:  Neoplasm of right breast, 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 Hot flashes  Otherwise patient appears to be tolerating tamoxifen fairly well  Breast cancer surveillance: 1. Breast exam 04/28/2018 is normal 2. Mammograms  06/11/2017: Calcifications in the right breast, diagnostic evaluation showed that these were benign, no mammographic evidence of malignancy, breast density category C  Return to clinic in 1 year for follow-up with long-term survivorship clinic   No orders of the defined types were placed in this encounter.  The patient has a good understanding of the overall plan. she agrees with it. she will call with any problems that may develop before the next visit here.   Harriette Ohara, MD 04/28/18

## 2018-04-28 NOTE — Telephone Encounter (Signed)
Gave avs and calendar ° °

## 2018-04-28 NOTE — Assessment & Plan Note (Signed)
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 Hot flashes 2. Mild myalgias Otherwise patient appears to be tolerating tamoxifen fairly well  Breast cancer surveillance: 1. Breast exam 04/28/2018 is normal 2. Mammograms  06/11/2017: Calcifications in the right breast, diagnostic evaluation showed that these were benign, no mammographic evidence of malignancy, breast density category C  Return to clinic in 1 year for follow-up

## 2018-04-28 NOTE — Telephone Encounter (Signed)
Rescheduled missed appointment from Nov 12 to Nov 19 @ 2:45

## 2018-05-20 ENCOUNTER — Other Ambulatory Visit: Payer: Self-pay | Admitting: Hematology and Oncology

## 2018-05-20 DIAGNOSIS — Z1231 Encounter for screening mammogram for malignant neoplasm of breast: Secondary | ICD-10-CM

## 2018-05-27 DIAGNOSIS — I10 Essential (primary) hypertension: Secondary | ICD-10-CM | POA: Diagnosis not present

## 2018-05-27 DIAGNOSIS — E785 Hyperlipidemia, unspecified: Secondary | ICD-10-CM | POA: Diagnosis not present

## 2018-05-27 DIAGNOSIS — Z853 Personal history of malignant neoplasm of breast: Secondary | ICD-10-CM | POA: Diagnosis not present

## 2018-06-04 ENCOUNTER — Other Ambulatory Visit: Payer: Self-pay | Admitting: Hematology and Oncology

## 2018-06-04 DIAGNOSIS — D0501 Lobular carcinoma in situ of right breast: Secondary | ICD-10-CM

## 2018-07-01 ENCOUNTER — Ambulatory Visit
Admission: RE | Admit: 2018-07-01 | Discharge: 2018-07-01 | Disposition: A | Discharge status: Home / Self Care | Payer: BLUE CROSS/BLUE SHIELD | Source: Ambulatory Visit | Attending: Hematology and Oncology | Admitting: Hematology and Oncology

## 2018-07-01 DIAGNOSIS — Z1231 Encounter for screening mammogram for malignant neoplasm of breast: Secondary | ICD-10-CM | POA: Diagnosis not present

## 2019-01-13 DIAGNOSIS — Z0000 Encounter for general adult medical examination without abnormal findings: Secondary | ICD-10-CM | POA: Diagnosis not present

## 2019-01-13 DIAGNOSIS — I10 Essential (primary) hypertension: Secondary | ICD-10-CM | POA: Diagnosis not present

## 2019-01-13 DIAGNOSIS — E785 Hyperlipidemia, unspecified: Secondary | ICD-10-CM | POA: Diagnosis not present

## 2019-02-10 DIAGNOSIS — E785 Hyperlipidemia, unspecified: Secondary | ICD-10-CM | POA: Diagnosis not present

## 2019-02-10 DIAGNOSIS — Z6830 Body mass index (BMI) 30.0-30.9, adult: Secondary | ICD-10-CM | POA: Diagnosis not present

## 2019-02-10 DIAGNOSIS — I10 Essential (primary) hypertension: Secondary | ICD-10-CM | POA: Diagnosis not present

## 2019-02-10 DIAGNOSIS — Z8249 Family history of ischemic heart disease and other diseases of the circulatory system: Secondary | ICD-10-CM | POA: Diagnosis not present

## 2019-02-18 DIAGNOSIS — Z6830 Body mass index (BMI) 30.0-30.9, adult: Secondary | ICD-10-CM | POA: Diagnosis not present

## 2019-02-18 DIAGNOSIS — Z01419 Encounter for gynecological examination (general) (routine) without abnormal findings: Secondary | ICD-10-CM | POA: Diagnosis not present

## 2019-02-18 DIAGNOSIS — E8941 Symptomatic postprocedural ovarian failure: Secondary | ICD-10-CM | POA: Diagnosis not present

## 2019-02-20 DIAGNOSIS — M25562 Pain in left knee: Secondary | ICD-10-CM | POA: Diagnosis not present

## 2019-02-20 DIAGNOSIS — M25561 Pain in right knee: Secondary | ICD-10-CM | POA: Diagnosis not present

## 2019-03-10 DIAGNOSIS — H40013 Open angle with borderline findings, low risk, bilateral: Secondary | ICD-10-CM | POA: Diagnosis not present

## 2019-03-27 DIAGNOSIS — S39012A Strain of muscle, fascia and tendon of lower back, initial encounter: Secondary | ICD-10-CM | POA: Diagnosis not present

## 2019-03-27 DIAGNOSIS — Z6832 Body mass index (BMI) 32.0-32.9, adult: Secondary | ICD-10-CM | POA: Diagnosis not present

## 2019-03-27 DIAGNOSIS — M5416 Radiculopathy, lumbar region: Secondary | ICD-10-CM | POA: Diagnosis not present

## 2019-03-29 DIAGNOSIS — S39012A Strain of muscle, fascia and tendon of lower back, initial encounter: Secondary | ICD-10-CM | POA: Diagnosis not present

## 2019-03-29 DIAGNOSIS — S161XXA Strain of muscle, fascia and tendon at neck level, initial encounter: Secondary | ICD-10-CM | POA: Diagnosis not present

## 2019-04-05 DIAGNOSIS — S39012D Strain of muscle, fascia and tendon of lower back, subsequent encounter: Secondary | ICD-10-CM | POA: Diagnosis not present

## 2019-04-05 DIAGNOSIS — S29012D Strain of muscle and tendon of back wall of thorax, subsequent encounter: Secondary | ICD-10-CM | POA: Diagnosis not present

## 2019-04-05 DIAGNOSIS — M542 Cervicalgia: Secondary | ICD-10-CM | POA: Diagnosis not present

## 2019-04-16 DIAGNOSIS — M62830 Muscle spasm of back: Secondary | ICD-10-CM | POA: Diagnosis not present

## 2019-04-16 DIAGNOSIS — M545 Low back pain: Secondary | ICD-10-CM | POA: Diagnosis not present

## 2019-04-16 DIAGNOSIS — M546 Pain in thoracic spine: Secondary | ICD-10-CM | POA: Diagnosis not present

## 2019-04-16 DIAGNOSIS — Z736 Limitation of activities due to disability: Secondary | ICD-10-CM | POA: Diagnosis not present

## 2019-04-19 DIAGNOSIS — M62830 Muscle spasm of back: Secondary | ICD-10-CM | POA: Diagnosis not present

## 2019-04-19 DIAGNOSIS — M546 Pain in thoracic spine: Secondary | ICD-10-CM | POA: Diagnosis not present

## 2019-04-19 DIAGNOSIS — M545 Low back pain: Secondary | ICD-10-CM | POA: Diagnosis not present

## 2019-04-19 DIAGNOSIS — Z736 Limitation of activities due to disability: Secondary | ICD-10-CM | POA: Diagnosis not present

## 2019-04-21 DIAGNOSIS — M546 Pain in thoracic spine: Secondary | ICD-10-CM | POA: Diagnosis not present

## 2019-04-21 DIAGNOSIS — M62830 Muscle spasm of back: Secondary | ICD-10-CM | POA: Diagnosis not present

## 2019-04-21 DIAGNOSIS — M545 Low back pain: Secondary | ICD-10-CM | POA: Diagnosis not present

## 2019-04-21 DIAGNOSIS — Z736 Limitation of activities due to disability: Secondary | ICD-10-CM | POA: Diagnosis not present

## 2019-04-23 DIAGNOSIS — M545 Low back pain: Secondary | ICD-10-CM | POA: Diagnosis not present

## 2019-04-23 DIAGNOSIS — Z736 Limitation of activities due to disability: Secondary | ICD-10-CM | POA: Diagnosis not present

## 2019-04-23 DIAGNOSIS — M62830 Muscle spasm of back: Secondary | ICD-10-CM | POA: Diagnosis not present

## 2019-04-23 DIAGNOSIS — M546 Pain in thoracic spine: Secondary | ICD-10-CM | POA: Diagnosis not present

## 2019-04-26 DIAGNOSIS — M546 Pain in thoracic spine: Secondary | ICD-10-CM | POA: Diagnosis not present

## 2019-04-26 DIAGNOSIS — Z736 Limitation of activities due to disability: Secondary | ICD-10-CM | POA: Diagnosis not present

## 2019-04-26 DIAGNOSIS — M545 Low back pain: Secondary | ICD-10-CM | POA: Diagnosis not present

## 2019-04-26 DIAGNOSIS — M62830 Muscle spasm of back: Secondary | ICD-10-CM | POA: Diagnosis not present

## 2019-04-28 DIAGNOSIS — Z736 Limitation of activities due to disability: Secondary | ICD-10-CM | POA: Diagnosis not present

## 2019-04-28 DIAGNOSIS — M545 Low back pain: Secondary | ICD-10-CM | POA: Diagnosis not present

## 2019-04-28 DIAGNOSIS — M62830 Muscle spasm of back: Secondary | ICD-10-CM | POA: Diagnosis not present

## 2019-04-28 DIAGNOSIS — M546 Pain in thoracic spine: Secondary | ICD-10-CM | POA: Diagnosis not present

## 2019-04-29 ENCOUNTER — Other Ambulatory Visit: Payer: Self-pay | Admitting: Hematology and Oncology

## 2019-04-29 ENCOUNTER — Inpatient Hospital Stay: Payer: BC Managed Care – PPO | Admitting: Adult Health

## 2019-04-29 DIAGNOSIS — Z1231 Encounter for screening mammogram for malignant neoplasm of breast: Secondary | ICD-10-CM

## 2019-04-30 DIAGNOSIS — M546 Pain in thoracic spine: Secondary | ICD-10-CM | POA: Diagnosis not present

## 2019-04-30 DIAGNOSIS — Z736 Limitation of activities due to disability: Secondary | ICD-10-CM | POA: Diagnosis not present

## 2019-04-30 DIAGNOSIS — M62830 Muscle spasm of back: Secondary | ICD-10-CM | POA: Diagnosis not present

## 2019-04-30 DIAGNOSIS — M545 Low back pain: Secondary | ICD-10-CM | POA: Diagnosis not present

## 2019-05-03 DIAGNOSIS — M545 Low back pain: Secondary | ICD-10-CM | POA: Diagnosis not present

## 2019-05-03 DIAGNOSIS — Z736 Limitation of activities due to disability: Secondary | ICD-10-CM | POA: Diagnosis not present

## 2019-05-03 DIAGNOSIS — M62830 Muscle spasm of back: Secondary | ICD-10-CM | POA: Diagnosis not present

## 2019-05-03 DIAGNOSIS — M546 Pain in thoracic spine: Secondary | ICD-10-CM | POA: Diagnosis not present

## 2019-05-05 DIAGNOSIS — M545 Low back pain: Secondary | ICD-10-CM | POA: Diagnosis not present

## 2019-05-05 DIAGNOSIS — M62830 Muscle spasm of back: Secondary | ICD-10-CM | POA: Diagnosis not present

## 2019-05-05 DIAGNOSIS — M546 Pain in thoracic spine: Secondary | ICD-10-CM | POA: Diagnosis not present

## 2019-05-05 DIAGNOSIS — Z736 Limitation of activities due to disability: Secondary | ICD-10-CM | POA: Diagnosis not present

## 2019-05-10 DIAGNOSIS — M62830 Muscle spasm of back: Secondary | ICD-10-CM | POA: Diagnosis not present

## 2019-05-10 DIAGNOSIS — M546 Pain in thoracic spine: Secondary | ICD-10-CM | POA: Diagnosis not present

## 2019-05-10 DIAGNOSIS — M545 Low back pain: Secondary | ICD-10-CM | POA: Diagnosis not present

## 2019-05-10 DIAGNOSIS — Z736 Limitation of activities due to disability: Secondary | ICD-10-CM | POA: Diagnosis not present

## 2019-05-12 DIAGNOSIS — M545 Low back pain: Secondary | ICD-10-CM | POA: Diagnosis not present

## 2019-05-12 DIAGNOSIS — M62830 Muscle spasm of back: Secondary | ICD-10-CM | POA: Diagnosis not present

## 2019-05-12 DIAGNOSIS — Z736 Limitation of activities due to disability: Secondary | ICD-10-CM | POA: Diagnosis not present

## 2019-05-12 DIAGNOSIS — M546 Pain in thoracic spine: Secondary | ICD-10-CM | POA: Diagnosis not present

## 2019-05-14 DIAGNOSIS — M546 Pain in thoracic spine: Secondary | ICD-10-CM | POA: Diagnosis not present

## 2019-05-14 DIAGNOSIS — Z736 Limitation of activities due to disability: Secondary | ICD-10-CM | POA: Diagnosis not present

## 2019-05-14 DIAGNOSIS — M545 Low back pain: Secondary | ICD-10-CM | POA: Diagnosis not present

## 2019-05-14 DIAGNOSIS — M62830 Muscle spasm of back: Secondary | ICD-10-CM | POA: Diagnosis not present

## 2019-05-17 DIAGNOSIS — M62830 Muscle spasm of back: Secondary | ICD-10-CM | POA: Diagnosis not present

## 2019-05-17 DIAGNOSIS — M546 Pain in thoracic spine: Secondary | ICD-10-CM | POA: Diagnosis not present

## 2019-05-17 DIAGNOSIS — Z736 Limitation of activities due to disability: Secondary | ICD-10-CM | POA: Diagnosis not present

## 2019-05-17 DIAGNOSIS — M545 Low back pain: Secondary | ICD-10-CM | POA: Diagnosis not present

## 2019-05-19 DIAGNOSIS — Z736 Limitation of activities due to disability: Secondary | ICD-10-CM | POA: Diagnosis not present

## 2019-05-19 DIAGNOSIS — M62830 Muscle spasm of back: Secondary | ICD-10-CM | POA: Diagnosis not present

## 2019-05-19 DIAGNOSIS — M545 Low back pain: Secondary | ICD-10-CM | POA: Diagnosis not present

## 2019-05-19 DIAGNOSIS — M546 Pain in thoracic spine: Secondary | ICD-10-CM | POA: Diagnosis not present

## 2019-05-24 DIAGNOSIS — Z736 Limitation of activities due to disability: Secondary | ICD-10-CM | POA: Diagnosis not present

## 2019-05-24 DIAGNOSIS — M62830 Muscle spasm of back: Secondary | ICD-10-CM | POA: Diagnosis not present

## 2019-05-24 DIAGNOSIS — M546 Pain in thoracic spine: Secondary | ICD-10-CM | POA: Diagnosis not present

## 2019-05-24 DIAGNOSIS — M545 Low back pain: Secondary | ICD-10-CM | POA: Diagnosis not present

## 2019-06-24 DIAGNOSIS — Z736 Limitation of activities due to disability: Secondary | ICD-10-CM | POA: Diagnosis not present

## 2019-06-24 DIAGNOSIS — M545 Low back pain: Secondary | ICD-10-CM | POA: Diagnosis not present

## 2019-06-24 DIAGNOSIS — M546 Pain in thoracic spine: Secondary | ICD-10-CM | POA: Diagnosis not present

## 2019-06-24 DIAGNOSIS — M62830 Muscle spasm of back: Secondary | ICD-10-CM | POA: Diagnosis not present

## 2019-06-25 DIAGNOSIS — M546 Pain in thoracic spine: Secondary | ICD-10-CM | POA: Diagnosis not present

## 2019-06-25 DIAGNOSIS — Z736 Limitation of activities due to disability: Secondary | ICD-10-CM | POA: Diagnosis not present

## 2019-06-25 DIAGNOSIS — M545 Low back pain: Secondary | ICD-10-CM | POA: Diagnosis not present

## 2019-06-25 DIAGNOSIS — M62830 Muscle spasm of back: Secondary | ICD-10-CM | POA: Diagnosis not present

## 2019-06-30 DIAGNOSIS — M545 Low back pain: Secondary | ICD-10-CM | POA: Diagnosis not present

## 2019-06-30 DIAGNOSIS — M62830 Muscle spasm of back: Secondary | ICD-10-CM | POA: Diagnosis not present

## 2019-06-30 DIAGNOSIS — Z736 Limitation of activities due to disability: Secondary | ICD-10-CM | POA: Diagnosis not present

## 2019-06-30 DIAGNOSIS — M546 Pain in thoracic spine: Secondary | ICD-10-CM | POA: Diagnosis not present

## 2019-07-02 DIAGNOSIS — M62830 Muscle spasm of back: Secondary | ICD-10-CM | POA: Diagnosis not present

## 2019-07-02 DIAGNOSIS — M545 Low back pain: Secondary | ICD-10-CM | POA: Diagnosis not present

## 2019-07-02 DIAGNOSIS — Z736 Limitation of activities due to disability: Secondary | ICD-10-CM | POA: Diagnosis not present

## 2019-07-02 DIAGNOSIS — M546 Pain in thoracic spine: Secondary | ICD-10-CM | POA: Diagnosis not present

## 2019-07-08 ENCOUNTER — Other Ambulatory Visit: Payer: Self-pay

## 2019-07-08 ENCOUNTER — Ambulatory Visit
Admission: RE | Admit: 2019-07-08 | Discharge: 2019-07-08 | Disposition: A | Discharge status: Home / Self Care | Payer: BC Managed Care – PPO | Source: Ambulatory Visit | Attending: Hematology and Oncology | Admitting: Hematology and Oncology

## 2019-07-08 DIAGNOSIS — Z1231 Encounter for screening mammogram for malignant neoplasm of breast: Secondary | ICD-10-CM | POA: Diagnosis not present

## 2019-07-19 DIAGNOSIS — E785 Hyperlipidemia, unspecified: Secondary | ICD-10-CM | POA: Diagnosis not present

## 2019-07-19 DIAGNOSIS — M2550 Pain in unspecified joint: Secondary | ICD-10-CM | POA: Diagnosis not present

## 2019-07-19 DIAGNOSIS — I10 Essential (primary) hypertension: Secondary | ICD-10-CM | POA: Diagnosis not present

## 2019-07-22 ENCOUNTER — Other Ambulatory Visit: Payer: Self-pay

## 2019-07-22 ENCOUNTER — Inpatient Hospital Stay: Payer: BC Managed Care – PPO | Attending: Adult Health | Admitting: Adult Health

## 2019-07-22 ENCOUNTER — Encounter: Payer: Self-pay | Admitting: Adult Health

## 2019-07-22 VITALS — BP 120/78 | HR 77 | Temp 98.0°F | Resp 18 | Ht 64.0 in | Wt 170.3 lb

## 2019-07-22 DIAGNOSIS — D0501 Lobular carcinoma in situ of right breast: Secondary | ICD-10-CM | POA: Diagnosis not present

## 2019-07-22 DIAGNOSIS — Z86000 Personal history of in-situ neoplasm of breast: Secondary | ICD-10-CM | POA: Insufficient documentation

## 2019-07-22 NOTE — Progress Notes (Signed)
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  Overall, she reports feeling quite well.   Wendy Gates is married and lives in [place] with her husband and 71 year old son.  She is working as a Estate agent at [employer].  Wendy Gates exercises regularly.  She does sit ups and is working on her core.  She has a treadmill.  She plays basketball with her son.  She sees Dr. Tamala Julian regularly.  She is up to date on Colon cancer screening and pap smears.      REVIEW OF SYSTEMS:  Review of Systems  Constitutional: Negative for appetite change, chills, fatigue, fever and unexpected weight change.  HENT:   Negative for hearing loss and sore throat.   Eyes: Negative for eye problems and icterus.  Respiratory: Negative for chest tightness, cough and shortness of breath.   Cardiovascular: Negative for chest pain, leg swelling and palpitations.  Gastrointestinal: Negative for abdominal distention, abdominal pain, constipation, diarrhea, nausea and vomiting.  Endocrine: Negative for hot flashes.  Musculoskeletal: Negative for arthralgias.  Skin: Negative for itching and rash.  Neurological: Negative for dizziness, extremity weakness, headaches and numbness.  Hematological: Negative for adenopathy. Does not bruise/bleed easily.  Psychiatric/Behavioral: Negative for depression. The patient is not nervous/anxious.   Breast: Denies any new nodularity, masses, tenderness, nipple changes, or nipple discharge.       PAST MEDICAL/SURGICAL  HISTORY:  Past Medical History:  Diagnosis Date  . Arthritis   . Cancer [facility])    right breast- lumpectomy   Past Surgical History:  Procedure Laterality Date  . BREAST EXCISIONAL BIOPSY Right    [facility]  . BREAST LUMPECTOMY WITH NEEDLE LOCALIZATION Right 07/05/2013   Procedure: RIGHT BREAST WIRE LOCALIZATION LUMPECTOMY ;  Surgeon: Merrie Roof, MD;  Location: [place];  Service: General;  Laterality: Right;  . BREAST SURGERY    . CYSTOSCOPY N/A 09/19/2014   Procedure: CYSTOSCOPY;  Surgeon: Crawford Givens, MD;  Location: [facility];  Service: Gynecology;  Laterality: N/A;  . LAPAROSCOPIC ASSISTED VAGINAL HYSTERECTOMY N/A 09/19/2014   Procedure: LAPAROSCOPIC ASSISTED VAGINAL HYSTERECTOMY;  Surgeon: Crawford Givens, MD;  Location: [facility];  Service: Gynecology;  Laterality: N/A;  . LAPAROSCOPIC BILATERAL SALPINGECTOMY Bilateral 09/19/2014   Procedure: LAPAROSCOPIC BILATERAL SALPINGECTOMY;  Surgeon: Crawford Givens, MD;  Location: [facility];  Service: Gynecology;  Laterality: Bilateral;  . TONSILLECTOMY    . WISDOM TOOTH EXTRACTION       ALLERGIES:  No Known Allergies   CURRENT MEDICATIONS:  Outpatient Encounter Medications as of 07/22/2019  Medication Sig  . amLODipine (NORVASC) 5 MG tablet Take 5 mg by mouth daily.  . Ascorbic Acid (VITAMIN C PO) Take by mouth daily.  Marland Kitchen CALCIUM PO Take by mouth daily.  . cholecalciferol (VITAMIN D) 1000 UNITS tablet Take 1,000 Units by mouth daily.  Marland Kitchen ibuprofen (ADVIL,MOTRIN) 600 MG tablet 1 po pc every 6 hours for 5 days then prn-pain  . Multiple Vitamins-Minerals (MULTIVITAMIN WITH MINERALS) tablet Take  1 tablet by mouth daily.  . Turmeric 500 MG CAPS Take 1 tablet daily by mouth.  . [DISCONTINUED] aspirin EC 81 MG tablet Take 81 mg by mouth daily.  . [DISCONTINUED] tamoxifen (NOLVADEX) 20 MG tablet TAKE 1 TABLET BY MOUTH ONCE DAILY (Patient not taking: Reported on 07/22/2019)   No facility-administered encounter medications on file as of  07/22/2019.     ONCOLOGIC FAMILY HISTORY:  Family History  Problem Relation Age of Onset  . Breast cancer Mother 35       and aunt  . Heart attack Father   . Breast cancer Maternal Aunt     GENETIC COUNSELING/TESTING: Not at this time  SOCIAL HISTORY:  Social History   Socioeconomic History  . Marital status: Married    Spouse name: Not on file  . Number of children: Not on file  . Years of education: Not on file  . Highest education level: Not on file  Occupational History  . Not on file  Tobacco Use  . Smoking status: Never Smoker  Substance and Sexual Activity  . Alcohol use: No  . Drug use: No  . Sexual activity: Not on file  Other Topics Concern  . Not on file  Social History Narrative  . Not on file   Social Determinants of Health   Financial Resource Strain:   . Difficulty of Paying Living Expenses: Not on file  Food Insecurity:   . Worried About Charity fundraiser in the Last Year: Not on file  . Ran Out of Food in the Last Year: Not on file  Transportation Needs:   . Lack of Transportation (Medical): Not on file  . Lack of Transportation (Non-Medical): Not on file  Physical Activity:   . Days of Exercise per Week: Not on file  . Minutes of Exercise per Session: Not on file  Stress:   . Feeling of Stress : Not on file  Social Connections:   . Frequency of Communication with Friends and Family: Not on file  . Frequency of Social Gatherings with Friends and Family: Not on file  . Attends Religious Services: Not on file  . Active Member of Clubs or Organizations: Not on file  . Attends Archivist Meetings: Not on file  . Marital Status: Not on file  Intimate Partner Violence:   . Fear of Current or Ex-Partner: Not on file  . Emotionally Abused: Not on file  . Physically Abused: Not on file  . Sexually Abused: Not on file      PHYSICAL EXAMINATION:  Vital Signs: Vitals:   07/22/19 1556  BP: 120/78  Pulse: 77  Resp: 18  Temp: 98  F (36.7 C)  SpO2: 100%   Filed Weights   07/22/19 1556  Weight: 170 lb 4.8 oz (77.2 kg)   General: Well-nourished, well-appearing female in no acute distress.  Unaccompanied/ today.   HEENT: Head is normocephalic.  Pupils equal and reactive to light. Conjunctivae clear without exudate.  Sclerae anicteric. Oral mucosa is pink, moist.  Oropharynx is pink without lesions or erythema.  Lymph: No cervical, supraclavicular, or infraclavicular lymphadenopathy noted on palpation.  Cardiovascular: Regular rate and rhythm.Marland Kitchen Respiratory: Clear to auscultation bilaterally. Chest expansion symmetric; breathing non-labored.  Breast Exam:  -Left breast: No appreciable masses on palpation. No skin redness, thickening, or peau d'orange appearance; no nipple retraction or nipple discharge; -Right breast: No appreciable masses on palpation. No skin redness, thickening, or peau d'orange appearance; no nipple retraction or  nipple discharge; -Axilla: No axillary adenopathy bilaterally.  GI: Abdomen soft and round; non-tender, non-distended. Bowel sounds normoactive. No hepatosplenomegaly.   GU: Deferred.  Neuro: No focal deficits. Steady gait.  Psych: Mood and affect normal and appropriate for situation.  MSK: No focal spinal tenderness to palpation, full range of motion in bilateral upper extremities Extremities: No edema. Skin: Warm and dry.  LABORATORY DATA:  None for this visit   DIAGNOSTIC IMAGING:  Most recent mammogram:   CLINICAL DATA:  Screening.  EXAM: DIGITAL SCREENING BILATERAL MAMMOGRAM WITH CAD  COMPARISON:  Previous exam(s).  ACR Breast Density Category c: The breast tissue is heterogeneously dense, which may obscure small masses.  FINDINGS: There are no findings suspicious for malignancy. Images were processed with CAD.  IMPRESSION: No mammographic evidence of malignancy. A result letter of this screening mammogram will be mailed directly to the  patient.  RECOMMENDATION: Screening mammogram in one year. (Code:SM-B-01Y)  BI-RADS CATEGORY  1: Negative.   Electronically Signed   By: Lillia Mountain M.D.   On: 07/12/2019 09:42     ASSESSMENT AND PLAN:  Ms.. Gates is a pleasant 54 y.o. female with history of LCIS who has completed lumpectomy and 5 years of anti estrogen therapy.  She presents to the Survivorship Clinic for surveillance and routine follow-up.   1. LCIS:  Wendy Gates is currently clinically and radiographically without evidence of disease. She will be due for mammogram in 07/2020; orders placed today.  I will see her back in one year for f/u.  I encouraged her to call me with any questions or concerns before her next visit at the cancer center, and I would be happy to see her sooner, if needed.    2. Bone health:  She was given education on specific food and activities to promote bone health.  3. Cancer screening:  Due to Wendy Gates's history and her age, she should receive screening for skin cancers, colon cancer, and gynecologic cancers. She was encouraged to follow-up with her PCP for appropriate cancer screenings.   4. Health maintenance and wellness promotion: Wendy Gates was encouraged to consume 5-7 servings of fruits and vegetables per day. She was also encouraged to engage in moderate to vigorous exercise for 30 minutes per day most days of the week. She was instructed to limit her alcohol consumption and continue to abstain from tobacco use.     Dispo:  -Return to cancer center in one year for LTS follow up -Mammogram in 07/2020  Total encounter time: 30 minutes*    Wilber Bihari, NP 07/22/19 4:32 PM Medical Oncology and Hematology [facility] [place], [place] [address] Tel. [phone]    Fax. [phone]   Note: [place], [place], [place] [phone]   *Total Encounter Time as defined by the Centers for Medicare and  Medicaid Services includes, in addition to the face-to-face time of a patient visit (documented in the note above) non-face-to-face time: obtaining and reviewing outside history, ordering and reviewing medications, tests or procedures, care coordination (communications with other health care professionals or caregivers) and documentation in the medical record.

## 2019-07-23 ENCOUNTER — Telehealth: Payer: Self-pay | Admitting: Adult Health

## 2019-07-23 NOTE — Telephone Encounter (Signed)
I talk with patient regarding schedule  

## 2019-08-19 DIAGNOSIS — I10 Essential (primary) hypertension: Secondary | ICD-10-CM | POA: Diagnosis not present

## 2019-08-19 DIAGNOSIS — M7711 Lateral epicondylitis, right elbow: Secondary | ICD-10-CM | POA: Diagnosis not present

## 2019-10-26 DIAGNOSIS — H40013 Open angle with borderline findings, low risk, bilateral: Secondary | ICD-10-CM | POA: Diagnosis not present

## 2020-01-19 DIAGNOSIS — Z0000 Encounter for general adult medical examination without abnormal findings: Secondary | ICD-10-CM | POA: Diagnosis not present

## 2020-01-19 DIAGNOSIS — E785 Hyperlipidemia, unspecified: Secondary | ICD-10-CM | POA: Diagnosis not present

## 2020-01-19 DIAGNOSIS — I10 Essential (primary) hypertension: Secondary | ICD-10-CM | POA: Diagnosis not present

## 2020-01-20 DIAGNOSIS — Z20828 Contact with and (suspected) exposure to other viral communicable diseases: Secondary | ICD-10-CM | POA: Diagnosis not present

## 2020-01-20 DIAGNOSIS — J029 Acute pharyngitis, unspecified: Secondary | ICD-10-CM | POA: Diagnosis not present

## 2020-02-01 DIAGNOSIS — E785 Hyperlipidemia, unspecified: Secondary | ICD-10-CM | POA: Diagnosis not present

## 2020-02-01 DIAGNOSIS — I10 Essential (primary) hypertension: Secondary | ICD-10-CM | POA: Diagnosis not present

## 2020-03-02 DIAGNOSIS — Z124 Encounter for screening for malignant neoplasm of cervix: Secondary | ICD-10-CM | POA: Diagnosis not present

## 2020-03-02 DIAGNOSIS — Z01419 Encounter for gynecological examination (general) (routine) without abnormal findings: Secondary | ICD-10-CM | POA: Diagnosis not present

## 2020-03-02 DIAGNOSIS — Z6830 Body mass index (BMI) 30.0-30.9, adult: Secondary | ICD-10-CM | POA: Diagnosis not present

## 2020-07-20 ENCOUNTER — Telehealth: Payer: Self-pay | Admitting: Adult Health

## 2020-07-20 NOTE — Telephone Encounter (Signed)
Rescheduled appts per LC schedule change. Pt confirmed new appt date and time.

## 2020-07-25 ENCOUNTER — Ambulatory Visit: Payer: BC Managed Care – PPO

## 2020-07-26 ENCOUNTER — Encounter: Payer: BC Managed Care – PPO | Admitting: Adult Health

## 2020-08-21 DIAGNOSIS — S39012A Strain of muscle, fascia and tendon of lower back, initial encounter: Secondary | ICD-10-CM | POA: Diagnosis not present

## 2020-08-28 ENCOUNTER — Inpatient Hospital Stay: Admission: RE | Admit: 2020-08-28 | Payer: BC Managed Care – PPO | Source: Ambulatory Visit

## 2020-08-28 ENCOUNTER — Other Ambulatory Visit: Payer: Self-pay | Admitting: Adult Health

## 2020-08-31 ENCOUNTER — Other Ambulatory Visit: Payer: Self-pay

## 2020-08-31 ENCOUNTER — Telehealth: Payer: Self-pay | Admitting: Adult Health

## 2020-08-31 ENCOUNTER — Encounter: Payer: Self-pay | Admitting: Adult Health

## 2020-08-31 ENCOUNTER — Inpatient Hospital Stay: Payer: BC Managed Care – PPO | Attending: Adult Health | Admitting: Adult Health

## 2020-08-31 VITALS — BP 134/81 | HR 82 | Temp 97.4°F | Resp 18 | Ht 64.0 in | Wt 173.6 lb

## 2020-08-31 DIAGNOSIS — Z79899 Other long term (current) drug therapy: Secondary | ICD-10-CM | POA: Diagnosis not present

## 2020-08-31 DIAGNOSIS — D0501 Lobular carcinoma in situ of right breast: Secondary | ICD-10-CM

## 2020-08-31 DIAGNOSIS — Z86000 Personal history of in-situ neoplasm of breast: Secondary | ICD-10-CM | POA: Diagnosis not present

## 2020-08-31 NOTE — Progress Notes (Signed)
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  Overall, she reports feeling quite well.   Wendy Gates is doing well today.  She says that she is getting back into exercise and she is up to date on her cancer screenings.  Her only concern today is that she hasn't undergone mammogram due to it being rescheduled a few times by the breast center and now it is scheduled in May.  She wants to know what she should do about this.    REVIEW OF SYSTEMS:  Review of Systems  Constitutional: Negative for appetite change, chills, fatigue, fever and unexpected weight change.  HENT:   Negative for hearing loss and sore throat.   Eyes: Negative for eye problems and icterus.  Respiratory: Negative for chest tightness, cough and shortness of breath.   Cardiovascular: Negative for chest pain, leg swelling and palpitations.  Gastrointestinal: Negative for abdominal distention, abdominal pain, constipation, diarrhea, nausea and vomiting.  Endocrine: Negative for hot flashes.  Musculoskeletal: Negative for arthralgias.  Skin: Negative for itching and rash.  Neurological: Negative for dizziness, extremity weakness, headaches and numbness.  Hematological: Negative for adenopathy. Does not bruise/bleed easily.  Psychiatric/Behavioral: Negative for depression. The patient is not nervous/anxious.   Breast: Denies any new nodularity, masses, tenderness, nipple changes, or nipple discharge.       PAST MEDICAL/SURGICAL  HISTORY:  Past Medical History:  Diagnosis Date  . Arthritis   . Cancer [facility])    right breast- lumpectomy   Past Surgical History:  Procedure Laterality Date  . BREAST EXCISIONAL BIOPSY Right    [facility]  . BREAST LUMPECTOMY WITH NEEDLE LOCALIZATION Right 07/05/2013   Procedure: RIGHT BREAST WIRE LOCALIZATION LUMPECTOMY ;  Surgeon: Merrie Roof, MD;  Location: [place];  Service: General;  Laterality: Right;  . BREAST SURGERY    . CYSTOSCOPY N/A 09/19/2014   Procedure: CYSTOSCOPY;  Surgeon: Crawford Givens, MD;  Location: [facility];  Service: Gynecology;  Laterality: N/A;  . LAPAROSCOPIC ASSISTED VAGINAL HYSTERECTOMY N/A 09/19/2014   Procedure: LAPAROSCOPIC ASSISTED VAGINAL HYSTERECTOMY;  Surgeon: Crawford Givens, MD;  Location: [facility];  Service: Gynecology;  Laterality: N/A;  . LAPAROSCOPIC BILATERAL SALPINGECTOMY Bilateral 09/19/2014   Procedure: LAPAROSCOPIC BILATERAL SALPINGECTOMY;  Surgeon: Crawford Givens, MD;  Location: [facility];  Service: Gynecology;  Laterality: Bilateral;  . TONSILLECTOMY    . WISDOM TOOTH EXTRACTION       ALLERGIES:  No Known Allergies   CURRENT MEDICATIONS:  Outpatient Encounter Medications as of 08/31/2020  Medication Sig  . amLODipine (NORVASC) 5 MG tablet Take 5 mg by mouth daily.  . Ascorbic Acid (VITAMIN C PO) Take by mouth daily.  Marland Kitchen CALCIUM PO Take by mouth daily.  . cholecalciferol (VITAMIN D) 1000 UNITS tablet Take 1,000 Units by mouth daily.  Marland Kitchen ibuprofen (ADVIL,MOTRIN) 600 MG tablet 1 po pc every 6 hours for 5 days then prn-pain  . Multiple Vitamins-Minerals (MULTIVITAMIN WITH MINERALS) tablet Take 1 tablet by mouth daily.  Marland Kitchen  Turmeric 500 MG CAPS Take 1 tablet daily by mouth.   No facility-administered encounter medications on file as of 08/31/2020.     ONCOLOGIC FAMILY HISTORY:  Family History  Problem Relation Age of Onset  . Breast cancer Mother 76       and aunt  . Heart attack Father   . Breast cancer Maternal Aunt      GENETIC COUNSELING/TESTING: Not at this time  SOCIAL HISTORY:  Social History   Socioeconomic History  . Marital status: Married    Spouse name: Not on file  . Number of children: Not on file  . Years of education: Not on file  . Highest education level: Not on file  Occupational History  . Not on file  Tobacco Use  . Smoking status: Never Smoker  . Smokeless tobacco: Not on file  Substance and Sexual Activity  . Alcohol use: No  . Drug use: No  . Sexual activity: Not on file  Other Topics Concern  . Not on file  Social History Narrative  . Not on file   Social Determinants of Health   Financial Resource Strain: Not on file  Food Insecurity: Not on file  Transportation Needs: Not on file  Physical Activity: Not on file  Stress: Not on file  Social Connections: Not on file  Intimate Partner Violence: Not on file      PHYSICAL EXAMINATION:  Vital Signs: Vitals:   08/31/20 0840  BP: 134/81  Pulse: 82  Resp: 18  Temp: (!) 97.4 F (36.3 C)   Filed Weights   08/31/20 0840  Weight: 173 lb 9.6 oz (78.7 kg)   General: Well-nourished, well-appearing female in no acute distress.  Unaccompanied/ today.   HEENT: Head is normocephalic.  Pupils equal and reactive to light. Conjunctivae clear without exudate.  Sclerae anicteric. Mask in place Lymph: No cervical, supraclavicular, or infraclavicular lymphadenopathy noted on palpation.  Cardiovascular: Regular rate and rhythm.Marland Kitchen Respiratory: Clear to auscultation bilaterally. Chest expansion symmetric; breathing non-labored.  Breast Exam:  -Left breast: No appreciable masses on palpation. No skin redness, thickening, or peau d'orange appearance; no nipple retraction or nipple discharge; -Right breast: No appreciable masses on palpation. No skin redness, thickening, or peau d'orange appearance; no nipple retraction or nipple discharge; -Axilla: No axillary adenopathy bilaterally.  GI: Abdomen soft and round; non-tender,  non-distended. Bowel sounds normoactive. No hepatosplenomegaly.   GU: Deferred.  Neuro: No focal deficits. Steady gait.  Psych: Mood and affect normal and appropriate for situation.  MSK: No focal spinal tenderness to palpation, full range of motion in bilateral upper extremities Extremities: No edema. Skin: Warm and dry.  LABORATORY DATA:  None for this visit   DIAGNOSTIC IMAGING:  Most recent mammogram:   CLINICAL DATA:  Screening.  EXAM: DIGITAL SCREENING BILATERAL MAMMOGRAM WITH CAD  COMPARISON:  Previous exam(s).  ACR Breast Density Category c: The breast tissue is heterogeneously dense, which may obscure small masses.  FINDINGS: There are no findings suspicious for malignancy. Images were processed with CAD.  IMPRESSION: No mammographic evidence of malignancy. A result letter of this screening mammogram will be mailed directly to the patient.  RECOMMENDATION: Screening mammogram in one year. (Code:SM-B-01Y)  BI-RADS CATEGORY  1: Negative.   Electronically Signed   By: Lillia Mountain M.D.   On: 07/12/2019 09:42     ASSESSMENT AND PLAN:  Ms.. Gates is a pleasant 55 y.o. female with history of LCIS who has completed lumpectomy and 5 years of anti estrogen  therapy.  She presents to the Survivorship Clinic for surveillance and routine follow-up.   1. LCIS:  Wendy Gates is currently clinically and radiographically without evidence of disease. Her mammogram is overdue.  I reached out to the breast center to learn more about the reason behind the delays and we also sent an order to [facility] to see if they can accommodate her sooner than May.  I will see her back in one year for f/u.  I encouraged her to call me with any questions or concerns before her next visit at the cancer center, and I would be happy to see her sooner, if needed.    2. Bone health:  She was given education on specific food and activities to promote bone health.  3. Cancer screening:   Due to Wendy Gates's history and her age, she should receive screening for skin cancers, colon cancer, and gynecologic cancers. She was encouraged to follow-up with her PCP for appropriate cancer screening, and she is doing a great job at staying up to date with her care.  4. Health maintenance and wellness promotion: Wendy Gates was encouraged to consume 5-7 servings of fruits and vegetables per day. She was also encouraged to engage in moderate to vigorous exercise for 30 minutes per day most days of the week. She was instructed to limit her alcohol consumption and continue to abstain from tobacco use.     Dispo:  -Return to cancer center in one year for LTS follow up -Mammogram overdue  Total encounter time: 20 minutes*    Wilber Bihari, NP 08/31/20 8:52 AM Medical Oncology and Hematology [facility] [place], [place] [address] Tel. [phone]    Fax. [phone]   Note: [place], [place], [place] [phone]   *Total Encounter Time as defined by the Centers for Medicare and Medicaid Services includes, in addition to the face-to-face time of a patient visit (documented in the note above) non-face-to-face time: obtaining and reviewing outside history, ordering and reviewing medications, tests or procedures, care coordination (communications with other health care professionals or caregivers) and documentation in the medical record.

## 2020-08-31 NOTE — Telephone Encounter (Signed)
Scheduled appts per 3/24 los. Pt aware.

## 2020-09-07 ENCOUNTER — Ambulatory Visit: Payer: BC Managed Care – PPO

## 2020-09-27 DIAGNOSIS — Z1231 Encounter for screening mammogram for malignant neoplasm of breast: Secondary | ICD-10-CM | POA: Diagnosis not present

## 2021-01-09 DIAGNOSIS — U071 COVID-19: Secondary | ICD-10-CM | POA: Diagnosis not present

## 2021-01-24 DIAGNOSIS — Z23 Encounter for immunization: Secondary | ICD-10-CM | POA: Diagnosis not present

## 2021-01-24 DIAGNOSIS — E785 Hyperlipidemia, unspecified: Secondary | ICD-10-CM | POA: Diagnosis not present

## 2021-01-24 DIAGNOSIS — Z0000 Encounter for general adult medical examination without abnormal findings: Secondary | ICD-10-CM | POA: Diagnosis not present

## 2021-01-24 DIAGNOSIS — I10 Essential (primary) hypertension: Secondary | ICD-10-CM | POA: Diagnosis not present

## 2021-01-24 DIAGNOSIS — N3281 Overactive bladder: Secondary | ICD-10-CM | POA: Diagnosis not present

## 2021-02-02 DIAGNOSIS — Z23 Encounter for immunization: Secondary | ICD-10-CM | POA: Diagnosis not present

## 2021-04-09 DIAGNOSIS — Z01419 Encounter for gynecological examination (general) (routine) without abnormal findings: Secondary | ICD-10-CM | POA: Diagnosis not present

## 2021-04-09 DIAGNOSIS — R32 Unspecified urinary incontinence: Secondary | ICD-10-CM | POA: Diagnosis not present

## 2021-05-01 DIAGNOSIS — H40013 Open angle with borderline findings, low risk, bilateral: Secondary | ICD-10-CM | POA: Diagnosis not present

## 2021-07-31 DIAGNOSIS — I10 Essential (primary) hypertension: Secondary | ICD-10-CM | POA: Diagnosis not present

## 2021-07-31 DIAGNOSIS — Z23 Encounter for immunization: Secondary | ICD-10-CM | POA: Diagnosis not present

## 2021-07-31 DIAGNOSIS — N3281 Overactive bladder: Secondary | ICD-10-CM | POA: Diagnosis not present

## 2021-07-31 DIAGNOSIS — E785 Hyperlipidemia, unspecified: Secondary | ICD-10-CM | POA: Diagnosis not present

## 2021-08-01 ENCOUNTER — Telehealth: Payer: Self-pay | Admitting: Adult Health

## 2021-08-01 NOTE — Telephone Encounter (Signed)
Rescheduled appointment per provider. Called patient to update her on the changes made to her appointment. Left message. Patient will be mailed an updated calendar.

## 2021-08-30 ENCOUNTER — Encounter: Payer: BC Managed Care – PPO | Admitting: Adult Health

## 2021-08-30 ENCOUNTER — Other Ambulatory Visit: Payer: Self-pay

## 2021-08-30 ENCOUNTER — Encounter: Payer: Self-pay | Admitting: Adult Health

## 2021-08-30 ENCOUNTER — Inpatient Hospital Stay: Payer: BC Managed Care – PPO | Attending: Adult Health | Admitting: Adult Health

## 2021-08-30 VITALS — BP 125/77 | HR 70 | Temp 97.7°F | Resp 16 | Ht 64.0 in | Wt 167.5 lb

## 2021-08-30 DIAGNOSIS — Z86000 Personal history of in-situ neoplasm of breast: Secondary | ICD-10-CM | POA: Diagnosis not present

## 2021-08-30 DIAGNOSIS — D0501 Lobular carcinoma in situ of right breast: Secondary | ICD-10-CM | POA: Diagnosis not present

## 2021-08-30 DIAGNOSIS — E785 Hyperlipidemia, unspecified: Secondary | ICD-10-CM | POA: Insufficient documentation

## 2021-08-30 DIAGNOSIS — I10 Essential (primary) hypertension: Secondary | ICD-10-CM | POA: Insufficient documentation

## 2021-08-30 DIAGNOSIS — N6310 Unspecified lump in the right breast, unspecified quadrant: Secondary | ICD-10-CM | POA: Diagnosis not present

## 2021-08-30 DIAGNOSIS — N3281 Overactive bladder: Secondary | ICD-10-CM | POA: Insufficient documentation

## 2021-08-30 NOTE — Progress Notes (Signed)
? ?CLINIC:  ?Survivorship  ? ?REASON FOR VISIT:  ?High risk for breast cancer ? ?BRIEF ONCOLOGIC HISTORY:  ?Oncology History  ?Lobular carcinoma in situ (LCIS) of right breast  ?07/05/2013 Surgery  ? Right lumpectomy: LCIS ?  ?08/05/2013 - 07/2018 Anti-estrogen oral therapy  ? Tamoxifen 20 mg daily x 5 years ?  ?09/19/2014 Surgery  ? Hysterectomy with BSO: Leiomyomata, benign changes ?  ? ? ? ?INTERVAL HISTORY:  ?Ms. Roberg presents to the [facility] today for routine follow-up for her history of LCIS.  Overall, she reports feeling quite well.  ? ?Vasilisa is doing well today.  Her most recent mammogram was completed on October 03, 2020 at [place].  There was no evidence of malignancy and breast density category C. ? ?She is doing well and has no new concerns other than a small 3 mm nodule in her central right chest wall.  This has been present for several months and doesn't seem to be growing.   ? ?She is exercising regularly and sees her PCP regularly.  She recently got a promotion at work and is enjoying taking on more responsibility.  She does take solace in the fact that if it becomes too much for her she can retire. ? ?REVIEW OF SYSTEMS:  ?Review of Systems  ?Constitutional:  Negative for appetite change, chills, fatigue, fever and unexpected weight change.  ?HENT:   Negative for hearing loss and sore throat.   ?Eyes:  Negative for eye problems and icterus.  ?Respiratory:  Negative for chest tightness, cough and shortness of breath.   ?Cardiovascular:  Negative for chest pain, leg swelling and palpitations.  ?Gastrointestinal:  Negative for abdominal distention, abdominal pain, constipation, diarrhea, nausea and vomiting.  ?Endocrine: Negative for hot flashes.  ?Musculoskeletal:  Negative for arthralgias.  ?Skin:  Negative for itching and rash.  ?Neurological:  Negative for dizziness, extremity weakness, headaches and numbness.  ?Hematological:  Negative for adenopathy. Does not bruise/bleed easily.   ?Psychiatric/Behavioral:  Negative for depression. The patient is not nervous/anxious.  Breast: Denies any new nodularity, masses, tenderness, nipple changes, or nipple discharge.  ? ? ? ? ? ?PAST MEDICAL/SURGICAL HISTORY:  ?Past Medical History:  ?Diagnosis Date  ? Arthritis   ? Cancer [facility])   ? right breast- lumpectomy  ? ?Past Surgical History:  ?Procedure Laterality Date  ? BREAST EXCISIONAL BIOPSY Right   ? Experiment  ? BREAST LUMPECTOMY WITH NEEDLE LOCALIZATION Right 07/05/2013  ? Procedure: RIGHT BREAST WIRE LOCALIZATION LUMPECTOMY ;  Surgeon: Merrie Roof, MD;  Location: [place];  Service: General;  Laterality: Right;  ? BREAST SURGERY    ? CYSTOSCOPY N/A 09/19/2014  ? Procedure: CYSTOSCOPY;  Surgeon: Crawford Givens, MD;  Location: [facility];  Service: Gynecology;  Laterality: N/A;  ? LAPAROSCOPIC ASSISTED VAGINAL HYSTERECTOMY N/A 09/19/2014  ? Procedure: LAPAROSCOPIC ASSISTED VAGINAL HYSTERECTOMY;  Surgeon: Crawford Givens, MD;  Location: [facility];  Service: Gynecology;  Laterality: N/A;  ? LAPAROSCOPIC BILATERAL SALPINGECTOMY Bilateral 09/19/2014  ? Procedure: LAPAROSCOPIC BILATERAL SALPINGECTOMY;  Surgeon: Crawford Givens, MD;  Location: [facility];  Service: Gynecology;  Laterality: Bilateral;  ? TONSILLECTOMY    ? WISDOM TOOTH EXTRACTION    ? ? ? ?ALLERGIES:  ?No Known Allergies ? ? ?CURRENT MEDICATIONS:  ?Outpatient Encounter Medications as of 08/30/2021  ?Medication Sig  ? tolterodine (DETROL LA) 4 MG 24 hr capsule tolterodine ER 4 mg capsule,extended release 24 hr ? TAKE 1 CAPSULE BY MOUTH ONCE DAILY FOR 30 DAYS  ?  amLODipine (NORVASC) 5 MG tablet Take 5 mg by mouth daily.  ? Ascorbic Acid (VITAMIN C PO) Take by mouth daily.  ? CALCIUM PO Take by mouth daily.  ? cholecalciferol (VITAMIN D) 1000 UNITS tablet Take 1,000 Units by mouth daily.  ? ibuprofen (ADVIL,MOTRIN) 600 MG tablet 1 po pc every 6 hours for 5 days then prn-pain  ? Multiple Vitamins-Minerals (MULTIVITAMIN WITH MINERALS) tablet Take 1  tablet by mouth daily.  ? Turmeric 500 MG CAPS Take 1 tablet daily by mouth.  ? ?No facility-administered encounter medications on file as of 08/30/2021.  ? ? ? ?ONCOLOGIC FAMILY HISTORY:  ?Family History  ?Problem Relation Age of Onset  ? Breast cancer Mother 84  ?     and aunt  ? Heart attack Father   ? Breast cancer Maternal Aunt   ? ? ?GENETIC COUNSELING/TESTING: ?Not at this time ? ?SOCIAL HISTORY:  ?Social History  ? ?Socioeconomic History  ? Marital status: Married  ?  Spouse name: Not on file  ? Number of children: Not on file  ? Years of education: Not on file  ? Highest education level: Not on file  ?Occupational History  ? Not on file  ?Tobacco Use  ? Smoking status: Never  ? Smokeless tobacco: Not on file  ?Substance and Sexual Activity  ? Alcohol use: No  ? Drug use: No  ? Sexual activity: Not on file  ?Other Topics Concern  ? Not on file  ?Social History Narrative  ? Not on file  ? ?Social Determinants of Health  ? ?Financial Resource Strain: Not on file  ?Food Insecurity: Not on file  ?Transportation Needs: Not on file  ?Physical Activity: Not on file  ?Stress: Not on file  ?Social Connections: Not on file  ?Intimate Partner Violence: Not on file  ? ? ? ? ?PHYSICAL EXAMINATION:  ?Vital Signs: ?Vitals:  ? 08/30/21 1006  ?BP: 125/77  ?Pulse: 70  ?Resp: 16  ?Temp: 97.7 ?F (36.5 ?C)  ?SpO2: 100%  ? ?Filed Weights  ? 08/30/21 1006  ?Weight: 167 lb 8 oz (76 kg)  ? ?General: Well-nourished, well-appearing female in no acute distress.  Unaccompanied/ today.   ?HEENT: Head is normocephalic.  Pupils equal and reactive to light. Conjunctivae clear without exudate.  Sclerae anicteric. Mask in place ?Lymph: No cervical, supraclavicular, or infraclavicular lymphadenopathy noted on palpation.  ?Cardiovascular: Regular rate and rhythm.Marland Kitchen ?Respiratory: Clear to auscultation bilaterally. Chest expansion symmetric; breathing non-labored.  ?Breast Exam:  ?-Left breast: No appreciable masses on palpation. No skin redness,  thickening, or peau d'orange appearance; no nipple retraction or nipple discharge; ?-Right breast: No appreciable masses on palpation. No skin redness, thickening, or peau d'orange appearance; no nipple retraction or nipple discharge; ?-small 3-4 mm nodule on central side, displaced laterally to the right ?-Axilla: No axillary adenopathy bilaterally.  ?GI: Abdomen soft and round; non-tender, non-distended. Bowel sounds normoactive. No hepatosplenomegaly.   ?GU: Deferred.  ?Neuro: No focal deficits. Steady gait.  ?Psych: Mood and affect normal and appropriate for situation.  ?MSK: No focal spinal tenderness to palpation, full range of motion in bilateral upper extremities ?Extremities: No edema. ?Skin: Warm and dry. ? ?LABORATORY DATA:  ?None for this visit ? ? ?DIAGNOSTIC IMAGING:  ?Most recent mammogram:  ? ? ?  ?ASSESSMENT AND PLAN:  ?Ms.Arliss Hepburn is a pleasant 56 y.o. female with history of LCIS who has completed lumpectomy and 5 years of anti estrogen therapy.  [facility] for  surveillance and routine follow-up.  ? ?1. LCIS:  Ms. Fanara is currently clinically and radiographically without evidence of disease.  She will undergo a repeat mammogram in April 2023 at [facility].  Because of this new central chest wall nodule we will do a bilateral diagnostic mammogram and right breast ultrasound to further evaluate this.  I let her know that a lot of times these small little areas and up being all cysts.  I will see her back in one year for f/u.  I encouraged her to call me with any questions or concerns before her next visit at the cancer center, and I would be happy to see her sooner, if needed.   ? ?2. Bone health:  She was given education on specific food and activities to promote bone health. ? ?3. Cancer screening:  Due to Ms. Laver's history and her age, she should receive screening for skin cancers, colon cancer, and gynecologic cancers. She was encouraged to follow-up with her  PCP for appropriate cancer screening, and she is doing a great job at staying up to date with her care. ? ?4. Health maintenance and wellness promotion: Ms. Teschner was encouraged to consume 5-7 servings of fru

## 2021-10-03 DIAGNOSIS — N6312 Unspecified lump in the right breast, upper inner quadrant: Secondary | ICD-10-CM | POA: Diagnosis not present

## 2021-10-03 DIAGNOSIS — N6311 Unspecified lump in the right breast, upper outer quadrant: Secondary | ICD-10-CM | POA: Diagnosis not present

## 2021-10-03 DIAGNOSIS — R922 Inconclusive mammogram: Secondary | ICD-10-CM | POA: Diagnosis not present

## 2021-10-09 ENCOUNTER — Other Ambulatory Visit: Payer: Self-pay | Admitting: Radiology

## 2021-10-09 DIAGNOSIS — R222 Localized swelling, mass and lump, trunk: Secondary | ICD-10-CM | POA: Diagnosis not present

## 2022-02-18 DIAGNOSIS — I10 Essential (primary) hypertension: Secondary | ICD-10-CM | POA: Diagnosis not present

## 2022-02-18 DIAGNOSIS — E785 Hyperlipidemia, unspecified: Secondary | ICD-10-CM | POA: Diagnosis not present

## 2022-02-18 DIAGNOSIS — Z0000 Encounter for general adult medical examination without abnormal findings: Secondary | ICD-10-CM | POA: Diagnosis not present

## 2022-02-18 DIAGNOSIS — N3281 Overactive bladder: Secondary | ICD-10-CM | POA: Diagnosis not present

## 2022-06-04 DIAGNOSIS — Z6830 Body mass index (BMI) 30.0-30.9, adult: Secondary | ICD-10-CM | POA: Diagnosis not present

## 2022-06-04 DIAGNOSIS — D229 Melanocytic nevi, unspecified: Secondary | ICD-10-CM | POA: Diagnosis not present

## 2022-06-04 DIAGNOSIS — Z01419 Encounter for gynecological examination (general) (routine) without abnormal findings: Secondary | ICD-10-CM | POA: Diagnosis not present

## 2022-06-04 DIAGNOSIS — Z90711 Acquired absence of uterus with remaining cervical stump: Secondary | ICD-10-CM | POA: Diagnosis not present

## 2022-08-19 DIAGNOSIS — E785 Hyperlipidemia, unspecified: Secondary | ICD-10-CM | POA: Diagnosis not present

## 2022-08-19 DIAGNOSIS — N3281 Overactive bladder: Secondary | ICD-10-CM | POA: Diagnosis not present

## 2022-08-19 DIAGNOSIS — I10 Essential (primary) hypertension: Secondary | ICD-10-CM | POA: Diagnosis not present

## 2022-09-02 ENCOUNTER — Encounter: Payer: Self-pay | Admitting: Adult Health

## 2022-09-02 ENCOUNTER — Inpatient Hospital Stay: Payer: BC Managed Care – PPO | Attending: Adult Health | Admitting: Adult Health

## 2022-09-02 ENCOUNTER — Other Ambulatory Visit: Payer: Self-pay

## 2022-09-02 VITALS — BP 140/84 | HR 67 | Temp 97.5°F | Resp 18 | Ht 64.0 in | Wt 167.8 lb

## 2022-09-02 DIAGNOSIS — Z86000 Personal history of in-situ neoplasm of breast: Secondary | ICD-10-CM | POA: Insufficient documentation

## 2022-09-02 DIAGNOSIS — D0501 Lobular carcinoma in situ of right breast: Secondary | ICD-10-CM | POA: Diagnosis not present

## 2022-09-02 NOTE — Progress Notes (Signed)
[facility] Follow up:    Wendy Ada, MD [address] [place] [address]   DIAGNOSIS: At increased risk for breast cancer  SUMMARY OF ONCOLOGIC HISTORY: Oncology History  Lobular carcinoma in situ (LCIS) of right breast  07/05/2013 Surgery   Right lumpectomy: LCIS   08/05/2013 - 07/2018 Anti-estrogen oral therapy   Tamoxifen 20 mg daily x 5 years   09/19/2014 Surgery   Hysterectomy with BSO: Leiomyomata, benign changes     CURRENT THERAPY: observation  INTERVAL HISTORY: Wendy Gates 57 y.o. female returns for f/u of her history of LCIS.  She continues on observation alone.  She underwent bilateral breast mammogram on October 03, 2021 that demonstrated a 0.6 cm superficial mass in the superior midline sternal region.  Ultrasound biopsy was completed of this area on Oct 10, 2021 demonstrating an epidermoid inclusion cyst concordant.  Her next mammogram is due in April 2024.  Wendy Gates tells me that she is doing well.  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  She recently was in a biggest loser weight challenge at her job and she tells me that she was the winter.  She has been working on [employer] and exercise.   Patient Active Problem List   Diagnosis Date Noted   Overactive bladder 08/30/2021   Hypertension 08/30/2021   Hyperlipidemia 08/30/2021   Fibroids 09/19/2014   Lobular carcinoma in situ (LCIS) of right breast 07/09/2013   Atypical lobular hyperplasia of breast 05/18/2013    has No Known Allergies.  MEDICAL HISTORY: Past Medical History:  Diagnosis Date   Arthritis    Cancer ([place] [facility])    right breast- lumpectomy    SURGICAL HISTORY: Past Surgical History:  Procedure Laterality Date   BREAST EXCISIONAL BIOPSY Right    [facility]   BREAST LUMPECTOMY WITH NEEDLE LOCALIZATION Right 07/05/2013   Procedure: RIGHT BREAST WIRE LOCALIZATION LUMPECTOMY ;  Surgeon: Merrie Roof, MD;  Location: [place];   Service: General;  Laterality: Right;   BREAST SURGERY     CYSTOSCOPY N/A 09/19/2014   Procedure: CYSTOSCOPY;  Surgeon: Crawford Givens, MD;  Location: [facility];  Service: Gynecology;  Laterality: N/A;   LAPAROSCOPIC ASSISTED VAGINAL HYSTERECTOMY N/A 09/19/2014   Procedure: LAPAROSCOPIC ASSISTED VAGINAL HYSTERECTOMY;  Surgeon: Crawford Givens, MD;  Location: [facility];  Service: Gynecology;  Laterality: N/A;   LAPAROSCOPIC BILATERAL SALPINGECTOMY Bilateral 09/19/2014   Procedure: LAPAROSCOPIC BILATERAL SALPINGECTOMY;  Surgeon: Crawford Givens, MD;  Location: [facility];  Service: Gynecology;  Laterality: Bilateral;   TONSILLECTOMY     WISDOM TOOTH EXTRACTION      SOCIAL HISTORY: Social History   Socioeconomic History   Marital status: Married    Spouse name: Not on file   Number of children: Not on file   Years of education: Not on file   Highest education level: Not on file  Occupational History   Not on file  Tobacco Use   Smoking status: Never   Smokeless tobacco: Not on file  Substance and Sexual Activity   Alcohol use: No   Drug use: No   Sexual activity: Not on file  Other Topics Concern   Not on file  Social History Narrative   Not on file   Social Determinants of Health   Financial Resource Strain: Not on file  Food Insecurity: Not on file  Transportation Needs: Not on file  Physical Activity: Not on file  Stress: Not on file  Social Connections: Not on file  Intimate Partner Violence: Not on file    FAMILY HISTORY: Family History  Problem Relation Age of Onset   Breast cancer Mother 34       and aunt   Heart attack Father    Breast cancer Maternal Aunt     Review of Systems  Constitutional:  Negative for appetite change, chills, fatigue, fever and unexpected weight change.  HENT:   Negative for hearing loss, lump/mass and trouble swallowing.   Eyes:  Negative for eye problems and icterus.  Respiratory:  Negative for chest tightness, cough and shortness of breath.    Cardiovascular:  Negative for chest pain, leg swelling and palpitations.  Gastrointestinal:  Negative for abdominal distention, abdominal pain, constipation, diarrhea, nausea and vomiting.  Endocrine: Negative for hot flashes.  Genitourinary:  Negative for difficulty urinating.   Musculoskeletal:  Negative for arthralgias.  Skin:  Negative for itching and rash.  Neurological:  Negative for dizziness, extremity weakness, headaches and numbness.  Hematological:  Negative for adenopathy. Does not bruise/bleed easily.  Psychiatric/Behavioral:  Negative for depression. The patient is not nervous/anxious.       PHYSICAL EXAMINATION  ECOG PERFORMANCE STATUS: 0 - Asymptomatic  Vitals:   09/02/22 0943  BP: (!) 140/84  Pulse: 67  Resp: 18  Temp: (!) 97.5 F (36.4 C)  SpO2: 100%    Physical Exam Constitutional:      General: She is not in acute distress.    Appearance: Normal appearance. She is not toxic-appearing.  HENT:     Head: Normocephalic and atraumatic.  Eyes:     General: No scleral icterus. Cardiovascular:     Rate and Rhythm: Normal rate and regular rhythm.     Pulses: Normal pulses.     Heart sounds: Normal heart sounds.  Pulmonary:     Effort: Pulmonary effort is normal.     Breath sounds: Normal breath sounds.  Abdominal:     General: Abdomen is flat. Bowel sounds are normal. There is no distension.     Palpations: Abdomen is soft.     Tenderness: There is no abdominal tenderness.  Musculoskeletal:        General: No swelling.     Cervical back: Neck supple.  Lymphadenopathy:     Cervical: No cervical adenopathy.  Skin:    General: Skin is warm and dry.     Findings: No rash.  Neurological:     General: No focal deficit present.     Mental Status: She is alert.  Psychiatric:        Mood and Affect: Mood normal.        Behavior: Behavior normal.     LABORATORY DATA:  None for this visit   ASSESSMENT and THERAPY PLAN:   Lobular carcinoma in situ  (LCIS) of right breast Wendy Gates is a 57 year old woman with history of lobular carcinoma in situ of the right breast diagnosed in January 2015 status postlumpectomy followed by tamoxifen x 5 years she completed in February 2020.  Wendy Gates has no clinical or radiographic signs of breast cancer.  She continues to undergo annual mammograms next due in April of this year.  I congratulated her on her weight loss and recommended continued healthy diet and exercise.  We discussed her health maintenance and she continues to follow-up with Dr. Tamala Julian her primary care provider regularly.  We will see Wendy Gates back in 1 year for continued long-term follow-up.   All questions were answered. The patient  knows to call the clinic with any problems, questions or concerns. We can certainly see the patient much sooner if necessary.  Total encounter time:20 minutes*in face-to-face visit time, chart review, lab review, care coordination, order entry, and documentation of the encounter time.    Wilber Bihari, NP 09/02/22 10:14 AM Medical Oncology and Hematology [facility] [place], [place] [address] Tel. [phone]    Fax. [phone]  *Total Encounter Time as defined by the Centers for Medicare and Medicaid Services includes, in addition to the face-to-face time of a patient visit (documented in the note above) non-face-to-face time: obtaining and reviewing outside history, ordering and reviewing medications, tests or procedures, care coordination (communications with other health care professionals or caregivers) and documentation in the medical record.

## 2022-09-02 NOTE — Assessment & Plan Note (Signed)
Wendy Gates is a 57 year old woman with history of lobular carcinoma in situ of the right breast diagnosed in January 2015 status postlumpectomy followed by tamoxifen x 5 years she completed in February 2020.  Wendy Gates has no clinical or radiographic signs of breast cancer.  She continues to undergo annual mammograms next due in April of this year.  I congratulated her on her weight loss and recommended continued healthy diet and exercise.  We discussed her health maintenance and she continues to follow-up with Dr. Tamala Julian her primary care provider regularly.  We will see Wendy Gates back in 1 year for continued long-term follow-up.

## 2022-10-08 DIAGNOSIS — Z1231 Encounter for screening mammogram for malignant neoplasm of breast: Secondary | ICD-10-CM | POA: Diagnosis not present

## 2022-10-21 DIAGNOSIS — R921 Mammographic calcification found on diagnostic imaging of breast: Secondary | ICD-10-CM | POA: Diagnosis not present

## 2022-10-21 DIAGNOSIS — Z8249 Family history of ischemic heart disease and other diseases of the circulatory system: Secondary | ICD-10-CM | POA: Diagnosis not present

## 2022-10-21 DIAGNOSIS — E785 Hyperlipidemia, unspecified: Secondary | ICD-10-CM | POA: Diagnosis not present

## 2022-11-07 ENCOUNTER — Ambulatory Visit: Payer: BC Managed Care – PPO | Admitting: Cardiology

## 2022-11-07 ENCOUNTER — Encounter: Payer: Self-pay | Admitting: Cardiology

## 2022-11-07 VITALS — BP 126/78 | HR 96 | Ht 62.0 in | Wt 170.0 lb

## 2022-11-07 DIAGNOSIS — I10 Essential (primary) hypertension: Secondary | ICD-10-CM

## 2022-11-07 DIAGNOSIS — Z8249 Family history of ischemic heart disease and other diseases of the circulatory system: Secondary | ICD-10-CM | POA: Diagnosis not present

## 2022-11-07 DIAGNOSIS — E7800 Pure hypercholesterolemia, unspecified: Secondary | ICD-10-CM | POA: Diagnosis not present

## 2022-11-07 DIAGNOSIS — E6609 Other obesity due to excess calories: Secondary | ICD-10-CM | POA: Diagnosis not present

## 2022-11-07 NOTE — Progress Notes (Signed)
Primary Physician/Referring:  Merri Brunette, MD  Patient ID: Wendy Gates, female    DOB: 1965-10-04, 57 y.o.   MRN: [id number]  Chief Complaint  Patient presents with   Family history of heart disease   New Patient (Initial Visit)   HPI:    Wendy Gates  is a 57 y.o. African-American female patient referred to me for risk stratification, she had mammograms done which revealed breast coronary calcification, she also has family history of premature coronary disease in her father who had coronary disease in his late 21s and one of the stairs has had coronary artery disease in late 72s.  Patient also has history of hypercholesterolemia and hypertension, recently started lipid-lowering therapy. She is asymptomatic  Past Medical History:  Diagnosis Date   Arthritis    Cancer ([facility])    right breast- lumpectomy   Past Surgical History:  Procedure Laterality Date   BREAST EXCISIONAL BIOPSY Right    [facility]   BREAST LUMPECTOMY WITH NEEDLE LOCALIZATION Right 07/05/2013   Procedure: RIGHT BREAST WIRE LOCALIZATION LUMPECTOMY ;  Surgeon: Robyne Askew, MD;  Location: [facility];  Service: General;  Laterality: Right;   BREAST SURGERY     CYSTOSCOPY N/A 09/19/2014   Procedure: CYSTOSCOPY;  Surgeon: Jaymes Graff, MD;  Location: [facility];  Service: Gynecology;  Laterality: N/A;   LAPAROSCOPIC ASSISTED VAGINAL HYSTERECTOMY N/A 09/19/2014   Procedure: LAPAROSCOPIC ASSISTED VAGINAL HYSTERECTOMY;  Surgeon: Jaymes Graff, MD;  Location: [facility];  Service: Gynecology;  Laterality: N/A;   LAPAROSCOPIC BILATERAL SALPINGECTOMY Bilateral 09/19/2014   Procedure: LAPAROSCOPIC BILATERAL SALPINGECTOMY;  Surgeon: Jaymes Graff, MD;  Location: [facility];  Service: Gynecology;  Laterality: Bilateral;   TONSILLECTOMY     WISDOM TOOTH EXTRACTION     Family History  Problem Relation Age of Onset   Breast cancer Mother 39       and aunt   Heart attack Father 28       Died in early 78Y   Hypertension  Sister    Hyperlipidemia Sister    Thyroid disease Sister    Diabetes Sister    Heart attack Sister 41   Hypertension Sister    Heart disease Sister    Anemia Sister    Diabetes Sister    Diabetes Brother    Hypertension Brother     Social History   Tobacco Use   Smoking status: Never   Smokeless tobacco: Not on file  Substance Use Topics   Alcohol use: No   Marital Status: Married  ROS  Review of Systems  Cardiovascular:  Negative for chest pain, dyspnea on exertion and leg swelling.   Objective      11/07/2022    1:13 PM 09/02/2022    9:43 AM 08/30/2021   10:06 AM  Vitals with BMI  Height 5\' 2"  5\' 4"  5\' 4"   Weight 170 lbs 167 lbs 13 oz 167 lbs 8 oz  BMI 31.09 28.79 28.74  Systolic 126 140 956  Diastolic 78 84 77  Pulse 96 67 70   SpO2: 96 %  Physical Exam Constitutional:      Appearance: She is obese.  Neck:     Vascular: No carotid bruit or JVD.  Cardiovascular:     Rate and Rhythm: Normal rate and regular rhythm.     Pulses: Intact distal pulses.     Heart sounds: Normal heart sounds. No murmur heard.    No gallop.  Pulmonary:     Effort: Pulmonary  effort is normal.     Breath sounds: Normal breath sounds.  Abdominal:     General: Bowel sounds are normal.     Palpations: Abdomen is soft.  Musculoskeletal:     Right lower leg: No edema.     Left lower leg: No edema.     Laboratory examination:   HEMOGLOBIN A1C Lab Results  Component Value Date   HGBA1C 5.3% 10/07/2015   No results found for: "TSH"   External labs:   Labs 10/21/2022:  Serum glucose 64 mg, BUN 14, creatinine 0.94, EGFR 71 mL, potassium 4.6, LFTs normal.  Total cholesterol 235, triglycerides 138, HDL 55, LDL 155.  Non-HDL cholesterol 179.  Radiology:    Cardiac Studies:   NA  EKG:   EKG 11/07/2022: Normal sinus rhythm at rate of 80 bpm, normal EKG.    Medications and allergies  No Known Allergies   Medication list   Current Outpatient Medications:     amLODipine (NORVASC) 5 MG tablet, Take 5 mg by mouth daily., Disp: , Rfl:    Ascorbic Acid (VITAMIN C PO), Take by mouth daily., Disp: , Rfl:    CALCIUM PO, Take by mouth daily., Disp: , Rfl:    cholecalciferol (VITAMIN D) 1000 UNITS tablet, Take 1,000 Units by mouth daily., Disp: , Rfl:    Cyanocobalamin (VITAMIN B-12 CR PO), Take 1 tablet by mouth daily at 2 PM., Disp: , Rfl:    ibuprofen (ADVIL,MOTRIN) 600 MG tablet, 1 po pc every 6 hours for 5 days then prn-pain, Disp: 30 tablet, Rfl: 1   Multiple Vitamins-Minerals (MULTIVITAMIN WITH MINERALS) tablet, Take 1 tablet by mouth daily., Disp: , Rfl:    naproxen (NAPROSYN) 500 MG tablet, 1 tablet with food or milk as needed Orally every 12 hrs as needed for joint pain for 30 days, Disp: , Rfl:    rosuvastatin (CRESTOR) 10 MG tablet, Take 10 mg by mouth daily., Disp: , Rfl:    tolterodine (DETROL LA) 4 MG 24 hr capsule, tolterodine ER 4 mg capsule,extended release 24 hr  TAKE 1 CAPSULE BY MOUTH ONCE DAILY FOR 30 DAYS, Disp: , Rfl:    Turmeric 500 MG CAPS, Take 1 tablet daily by mouth., Disp: , Rfl:   Assessment     ICD-10-CM   1. Family history of heart disease  Z82.49 EKG 12-Lead    2. Primary hypertension  I10     3. Pure hypercholesterolemia  E78.00     4. Class 1 obesity due to excess calories without serious comorbidity with body mass index (BMI) of 31.0 to 31.9 in adult  E66.09    Z68.31        Orders Placed This Encounter  Procedures   EKG 12-Lead   No orders of the defined types were placed in this encounter.  Medications Discontinued During This Encounter  Medication Reason   methocarbamol (ROBAXIN) 500 MG tablet      Recommendations:   Wendy Gates is a 57 y.o. African-American female patient referred to me for risk stratification, she had mammograms done which revealed breast coronary calcification, she also has family history of premature coronary disease in her father who had coronary disease in his late 61s and one  of the stairs has had coronary artery disease in late 77s.  Patient also has history of hypercholesterolemia and hypertension, recently started lipid-lowering therapy.  1. Family history of heart disease Although patient has family history of premature heart disease, I do not think she needs further  cardiac risk stratification, primary prevention is indicated. - EKG 12-Lead  2. Primary hypertension Blood pressure is well-controlled on amlodipine, continue the same.  If second agent needs to be added, an ACE inhibitor or ARB would be most appropriate.  3. Pure hypercholesterolemia Patient's lipids are elevated, she was recently started on Crestor 10 mg daily.  Would recommend that we recheck lipids in 6 to 8 weeks, if LDL is >100, consider increasing the dose of Crestor to 20 mg daily.  This can be done by her PCP.  4. Class 1 obesity due to excess calories without serious comorbidity with body mass index (BMI) of 31.0 to 31.9 in adult As patient is asymptomatic, she goes to the gym at least pulmonary 4 times a week and is able to walk on the treadmill at 3 miles.  For 30 minutes, with no other symptoms of chest pain or dyspnea.  EKG is normal, except for mild obesity, physical examination is also normal.  Vascular examination is normal.  Hence in the absence of symptoms, would not recommend cardiac workup, primary prevention with weight loss, blood pressure control, lipid lowering therapy would be the most appropriate.  However patient was not willing to comply with medical advice, then I would have performed coronary calcium score and/or further cardiac testing.  She is willing to continue statin therapy for life.  I have extensively discussed with the patient regarding this, unless she develops any new symptoms, I will see her back on a as needed basis.   Wendy Decamp, MD, [facility] 11/07/2022, 1:56 PM Office: [phone]

## 2023-09-02 ENCOUNTER — Encounter: Payer: Self-pay | Admitting: Adult Health

## 2023-09-02 ENCOUNTER — Inpatient Hospital Stay: Payer: No Typology Code available for payment source | Attending: Adult Health | Admitting: Adult Health

## 2023-09-02 VITALS — BP 116/74 | HR 76 | Temp 98.0°F | Resp 18 | Ht 62.0 in | Wt 168.9 lb

## 2023-09-02 DIAGNOSIS — Z86000 Personal history of in-situ neoplasm of breast: Secondary | ICD-10-CM | POA: Insufficient documentation

## 2023-09-02 DIAGNOSIS — Z803 Family history of malignant neoplasm of breast: Secondary | ICD-10-CM | POA: Diagnosis not present

## 2023-09-02 DIAGNOSIS — D0501 Lobular carcinoma in situ of right breast: Secondary | ICD-10-CM

## 2023-09-02 NOTE — Assessment & Plan Note (Signed)
 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  Breast Cancer Screening Discussed lifestyle modifications and dietary impact on breast cancer risk. Emphasized importance of regular screenings and awareness of food additives. - Order mammogram in April. - Encourage continuation of regular exercise and healthy diet. -Continue follow-up with primary care provider for preventative health care needs. - Advise to contact clinic if concerns arise before next visit.  RTC in 1 year

## 2023-09-02 NOTE — Progress Notes (Signed)
 [facility] Cancer Follow up:    Wendy Brunette, MD [phone] [place] [address]   DIAGNOSIS:  Cancer Staging  No matching staging information was found for the patient.   SUMMARY OF ONCOLOGIC HISTORY: Oncology History  Lobular carcinoma in situ (LCIS) of right breast  07/05/2013 Surgery   Right lumpectomy: LCIS   08/05/2013 - 07/2018 Anti-estrogen oral therapy   Tamoxifen 20 mg daily x 5 years   09/19/2014 Surgery   Hysterectomy with BSO: Leiomyomata, benign changes     CURRENT THERAPY: Observation  INTERVAL HISTORY: Discussed the use of AI scribe software for clinical note transcription with the patient, who gave verbal consent to proceed.  Wendy Gates 58 y.o. female returns for follow-up and evaluation of her increased risk to develop breast cancer due to LCIS from 2015.  She continues to undergo annual mammograms most recently in April 2024 at [facility].  We do not yet have this scanned into our system.   She reports no health changes since her last visit. She has been working full time and exercising four days a week, including walking for thirty minutes and weight lifting for another thirty minutes. She has been trying to make dietary changes, including stopping drinking sodas, coffee, and tea, and is considering using an app to monitor food additives. She has not had any new symptoms or health concerns.  Patient Active Problem List   Diagnosis Date Noted   Overactive bladder 08/30/2021   Hypertension 08/30/2021   Hyperlipidemia 08/30/2021   Fibroids 09/19/2014   Lobular carcinoma in situ (LCIS) of right breast 07/09/2013   Atypical lobular hyperplasia of breast 05/18/2013    has no known allergies.  MEDICAL HISTORY: Past Medical History:  Diagnosis Date   Arthritis    Cancer (HCC)    right breast- lumpectomy    SURGICAL HISTORY: Past Surgical History:  Procedure Laterality Date   BREAST EXCISIONAL BIOPSY Right    [facility]    BREAST LUMPECTOMY WITH NEEDLE LOCALIZATION Right 07/05/2013   Procedure: RIGHT BREAST WIRE LOCALIZATION LUMPECTOMY ;  Surgeon: Robyne Askew, MD;  Location: [facility];  Service: General;  Laterality: Right;   BREAST SURGERY     CYSTOSCOPY N/A 09/19/2014   Procedure: CYSTOSCOPY;  Surgeon: Jaymes Graff, MD;  Location: [facility];  Service: Gynecology;  Laterality: N/A;   LAPAROSCOPIC ASSISTED VAGINAL HYSTERECTOMY N/A 09/19/2014   Procedure: LAPAROSCOPIC ASSISTED VAGINAL HYSTERECTOMY;  Surgeon: Jaymes Graff, MD;  Location: [facility];  Service: Gynecology;  Laterality: N/A;   LAPAROSCOPIC BILATERAL SALPINGECTOMY Bilateral 09/19/2014   Procedure: LAPAROSCOPIC BILATERAL SALPINGECTOMY;  Surgeon: Jaymes Graff, MD;  Location: [facility];  Service: Gynecology;  Laterality: Bilateral;   TONSILLECTOMY     WISDOM TOOTH EXTRACTION      SOCIAL HISTORY: Social History   Socioeconomic History   Marital status: Married    Spouse name: Not on file   Number of children: 1   Years of education: Not on file   Highest education level: Not on file  Occupational History   Not on file  Tobacco Use   Smoking status: Never   Smokeless tobacco: Not on file  Vaping Use   Vaping status: Never Used  Substance and Sexual Activity   Alcohol use: No   Drug use: No   Sexual activity: Not on file  Other Topics Concern   Not on file  Social History Narrative   Not on file   Social Drivers of  Health   Financial Resource Strain: Not on file  Food Insecurity: Not on file  Transportation Needs: Not on file  Physical Activity: Not on file  Stress: Not on file  Social Connections: Not on file  Intimate Partner Violence: Not on file    FAMILY HISTORY: Family History  Problem Relation Age of Onset   Breast cancer Mother 37       and aunt   Heart attack Father 32       Died in early [place]   Hypertension Sister    Hyperlipidemia Sister    Thyroid disease Sister    Diabetes Sister    Heart attack  Sister 85   Hypertension Sister    Heart disease Sister    Anemia Sister    Diabetes Sister    Diabetes Brother    Hypertension Brother     Review of Systems  Constitutional:  Negative for appetite change, chills, fatigue, fever and unexpected weight change.  HENT:   Negative for hearing loss, lump/mass and trouble swallowing.   Eyes:  Negative for eye problems and icterus.  Respiratory:  Negative for chest tightness, cough and shortness of breath.   Cardiovascular:  Negative for chest pain, leg swelling and palpitations.  Gastrointestinal:  Negative for abdominal distention, abdominal pain, constipation, diarrhea, nausea and vomiting.  Endocrine: Negative for hot flashes.  Genitourinary:  Negative for difficulty urinating.   Musculoskeletal:  Negative for arthralgias.  Skin:  Negative for itching and rash.  Neurological:  Negative for dizziness, extremity weakness, headaches and numbness.  Hematological:  Negative for adenopathy. Does not bruise/bleed easily.  Psychiatric/Behavioral:  Negative for depression. The patient is not nervous/anxious.       PHYSICAL EXAMINATION    Vitals:   09/02/23 0950  BP: 116/74  Pulse: 76  Resp: 18  Temp: 98 F (36.7 C)  SpO2: 100%    Physical Exam Constitutional:      General: She is not in acute distress.    Appearance: Normal appearance. She is not toxic-appearing.  HENT:     Head: Normocephalic and atraumatic.     Mouth/Throat:     Mouth: Mucous membranes are moist.     Pharynx: Oropharynx is clear. No oropharyngeal exudate or posterior oropharyngeal erythema.  Eyes:     General: No scleral icterus. Cardiovascular:     Rate and Rhythm: Normal rate and regular rhythm.     Pulses: Normal pulses.     Heart sounds: Normal heart sounds.  Pulmonary:     Effort: Pulmonary effort is normal.     Breath sounds: Normal breath sounds.  Chest:     Comments: Right breast status postlumpectomy, benign left breast benign Abdominal:      General: Abdomen is flat. Bowel sounds are normal. There is no distension.     Palpations: Abdomen is soft.     Tenderness: There is no abdominal tenderness.  Musculoskeletal:        General: No swelling.     Cervical back: Neck supple.  Lymphadenopathy:     Cervical: No cervical adenopathy.     Upper Body:     Right upper body: No supraclavicular or axillary adenopathy.     Left upper body: No supraclavicular or axillary adenopathy.  Skin:    General: Skin is warm and dry.     Findings: No rash.  Neurological:     General: No focal deficit present.     Mental Status: She is alert.  Psychiatric:  Mood and Affect: Mood normal.        Behavior: Behavior normal.     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  Breast Cancer Screening Discussed lifestyle modifications and dietary impact on breast cancer risk. Emphasized importance of regular screenings and awareness of food additives. - Order mammogram in April. - Encourage continuation of regular exercise and healthy diet. -Continue follow-up with primary care provider for preventative health care needs. - Advise to contact clinic if concerns arise before next visit.  RTC in 1 year  All questions were answered. The patient knows to call the clinic with any problems, questions or concerns. We can certainly see the patient much sooner if necessary.  Total encounter time:20 minutes*in face-to-face visit time, chart review, lab review, care coordination, order entry, and documentation of the encounter time.    Lillard Anes, NP 09/02/23 1:33 PM Medical Oncology and Hematology [facility] [address] [place], [place] [address] Tel. [phone]    Fax.  [phone]  *Total Encounter Time as defined by the Centers for Medicare and Medicaid Services includes, in addition to the face-to-face time of a patient visit (documented in the note above) non-face-to-face time: obtaining and reviewing outside history, ordering and reviewing medications, tests or procedures, care coordination (communications with other health care professionals or caregivers) and documentation in the medical record.

## 2023-09-04 ENCOUNTER — Encounter: Payer: Self-pay | Admitting: Adult Health

## 2024-09-01 ENCOUNTER — Inpatient Hospital Stay

## 2024-09-01 ENCOUNTER — Ambulatory Visit: Admitting: Adult Health
